# Patient Record
Sex: Female | Born: 1955 | Race: Black or African American | Hispanic: No | Smoking: Former smoker
Health system: Southern US, Community
[De-identification: ages and names within clinical notes are randomized; demographics above are authoritative.]

## PROBLEM LIST (undated history)

## (undated) ENCOUNTER — Emergency Department (HOSPITAL_COMMUNITY): Admission: EM | Payer: BLUE CROSS/BLUE SHIELD | Source: Home / Self Care

## (undated) DIAGNOSIS — D573 Sickle-cell trait: Secondary | ICD-10-CM

## (undated) DIAGNOSIS — E119 Type 2 diabetes mellitus without complications: Secondary | ICD-10-CM

## (undated) DIAGNOSIS — T7840XA Allergy, unspecified, initial encounter: Secondary | ICD-10-CM

## (undated) HISTORY — PX: DG OS CALCIS BILAT: HXRAD269

## (undated) HISTORY — DX: Sickle-cell trait: D57.3

## (undated) HISTORY — DX: Allergy, unspecified, initial encounter: T78.40XA

## (undated) HISTORY — PX: OTHER SURGICAL HISTORY: SHX169

## (undated) HISTORY — PX: DILATION AND CURETTAGE OF UTERUS: SHX78

## (undated) HISTORY — PX: TOOTH EXTRACTION: SUR596

---

## 1999-09-07 ENCOUNTER — Encounter: Payer: Self-pay | Admitting: Endocrinology

## 1999-09-07 ENCOUNTER — Encounter: Admission: RE | Admit: 1999-09-07 | Discharge: 1999-09-07 | Payer: Self-pay | Admitting: Endocrinology

## 1999-11-11 ENCOUNTER — Ambulatory Visit (HOSPITAL_COMMUNITY): Admission: RE | Admit: 1999-11-11 | Discharge: 1999-11-11 | Payer: Self-pay | Admitting: Obstetrics and Gynecology

## 1999-11-11 ENCOUNTER — Encounter: Payer: Self-pay | Admitting: Obstetrics and Gynecology

## 1999-11-24 ENCOUNTER — Ambulatory Visit (HOSPITAL_COMMUNITY): Admission: RE | Admit: 1999-11-24 | Discharge: 1999-11-24 | Payer: Self-pay | Admitting: Obstetrics and Gynecology

## 1999-11-24 ENCOUNTER — Encounter (INDEPENDENT_AMBULATORY_CARE_PROVIDER_SITE_OTHER): Payer: Self-pay

## 2000-11-17 ENCOUNTER — Encounter: Admission: RE | Admit: 2000-11-17 | Discharge: 2000-11-17 | Payer: Self-pay | Admitting: Internal Medicine

## 2000-11-17 ENCOUNTER — Encounter: Payer: Self-pay | Admitting: Internal Medicine

## 2001-03-05 ENCOUNTER — Encounter: Payer: Self-pay | Admitting: Internal Medicine

## 2001-03-05 ENCOUNTER — Encounter: Admission: RE | Admit: 2001-03-05 | Discharge: 2001-03-05 | Payer: Self-pay | Admitting: Internal Medicine

## 2002-03-29 ENCOUNTER — Other Ambulatory Visit: Admission: RE | Admit: 2002-03-29 | Discharge: 2002-03-29 | Payer: Self-pay | Admitting: Obstetrics and Gynecology

## 2002-04-15 ENCOUNTER — Encounter: Admission: RE | Admit: 2002-04-15 | Discharge: 2002-04-15 | Payer: Self-pay | Admitting: Internal Medicine

## 2002-04-15 ENCOUNTER — Encounter: Payer: Self-pay | Admitting: Internal Medicine

## 2002-04-24 ENCOUNTER — Encounter: Admission: RE | Admit: 2002-04-24 | Discharge: 2002-04-24 | Payer: Self-pay | Admitting: Internal Medicine

## 2002-04-24 ENCOUNTER — Encounter: Payer: Self-pay | Admitting: Internal Medicine

## 2002-05-27 ENCOUNTER — Emergency Department (HOSPITAL_COMMUNITY): Admission: EM | Admit: 2002-05-27 | Discharge: 2002-05-27 | Payer: Self-pay | Admitting: Emergency Medicine

## 2002-06-05 ENCOUNTER — Encounter: Payer: Self-pay | Admitting: Internal Medicine

## 2002-06-05 ENCOUNTER — Encounter: Admission: RE | Admit: 2002-06-05 | Discharge: 2002-06-05 | Payer: Self-pay | Admitting: Internal Medicine

## 2002-10-02 ENCOUNTER — Encounter: Admission: RE | Admit: 2002-10-02 | Discharge: 2002-10-02 | Payer: Self-pay | Admitting: Internal Medicine

## 2002-10-02 ENCOUNTER — Encounter: Payer: Self-pay | Admitting: Internal Medicine

## 2004-08-25 ENCOUNTER — Other Ambulatory Visit: Admission: RE | Admit: 2004-08-25 | Discharge: 2004-08-25 | Payer: Self-pay | Admitting: Obstetrics and Gynecology

## 2004-09-09 ENCOUNTER — Ambulatory Visit (HOSPITAL_COMMUNITY): Admission: RE | Admit: 2004-09-09 | Discharge: 2004-09-09 | Payer: Self-pay | Admitting: Obstetrics and Gynecology

## 2005-12-14 ENCOUNTER — Other Ambulatory Visit: Admission: RE | Admit: 2005-12-14 | Discharge: 2005-12-14 | Payer: Self-pay | Admitting: Obstetrics and Gynecology

## 2006-12-30 ENCOUNTER — Encounter: Admission: RE | Admit: 2006-12-30 | Discharge: 2006-12-30 | Payer: Self-pay | Admitting: Emergency Medicine

## 2007-11-21 ENCOUNTER — Emergency Department (HOSPITAL_COMMUNITY): Admission: EM | Admit: 2007-11-21 | Discharge: 2007-11-21 | Payer: Self-pay | Admitting: Emergency Medicine

## 2009-01-12 ENCOUNTER — Emergency Department (HOSPITAL_COMMUNITY): Admission: EM | Admit: 2009-01-12 | Discharge: 2009-01-12 | Payer: Self-pay | Admitting: Emergency Medicine

## 2009-01-24 IMAGING — CR DG CHEST 2V
2 series · 2 of 2 positions shown · non-contrast
Comparison: none

CLINICAL DATA: Wheezing, cough, congestion, sore throat. 
 CHEST - 2 VIEW:

[w chest pa *]
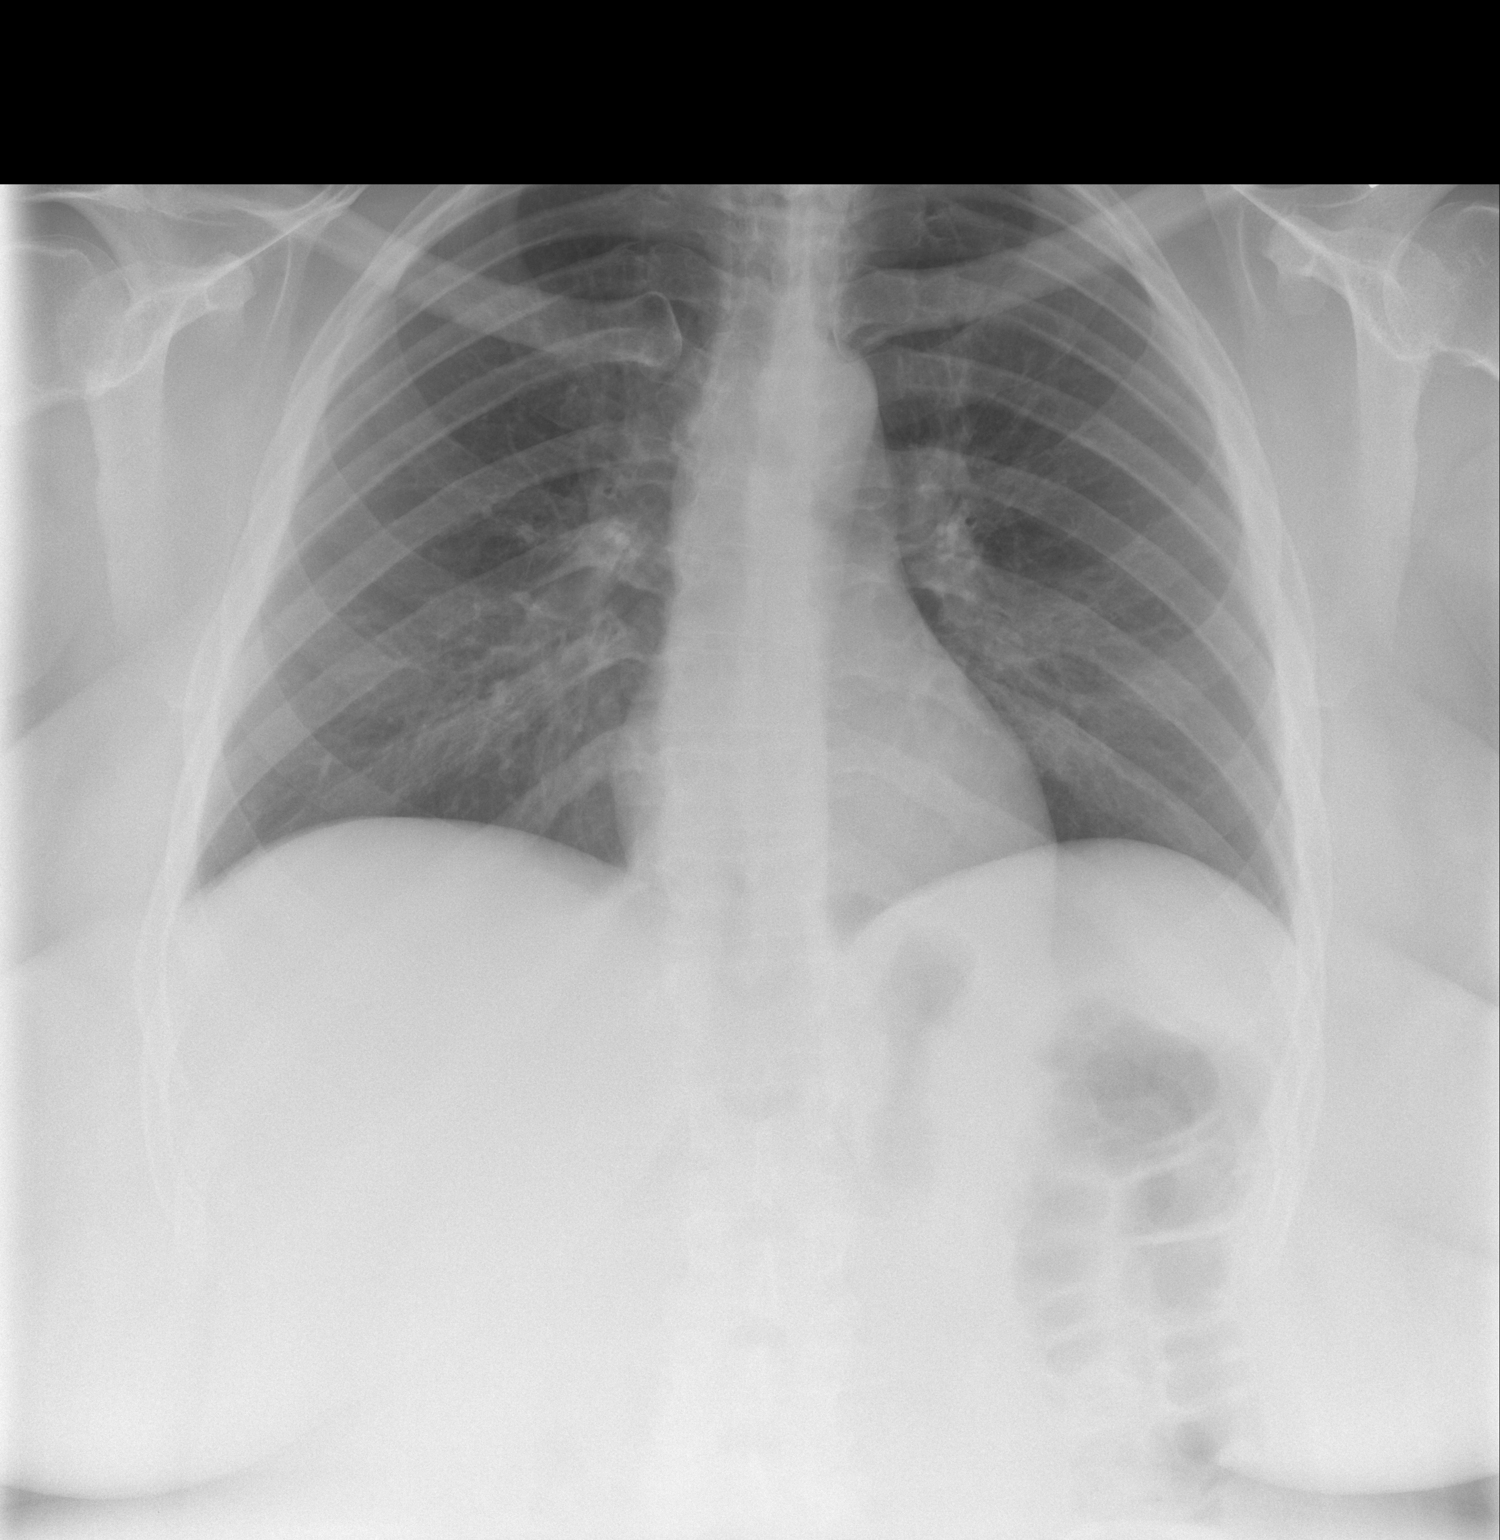

[w chest lat *]
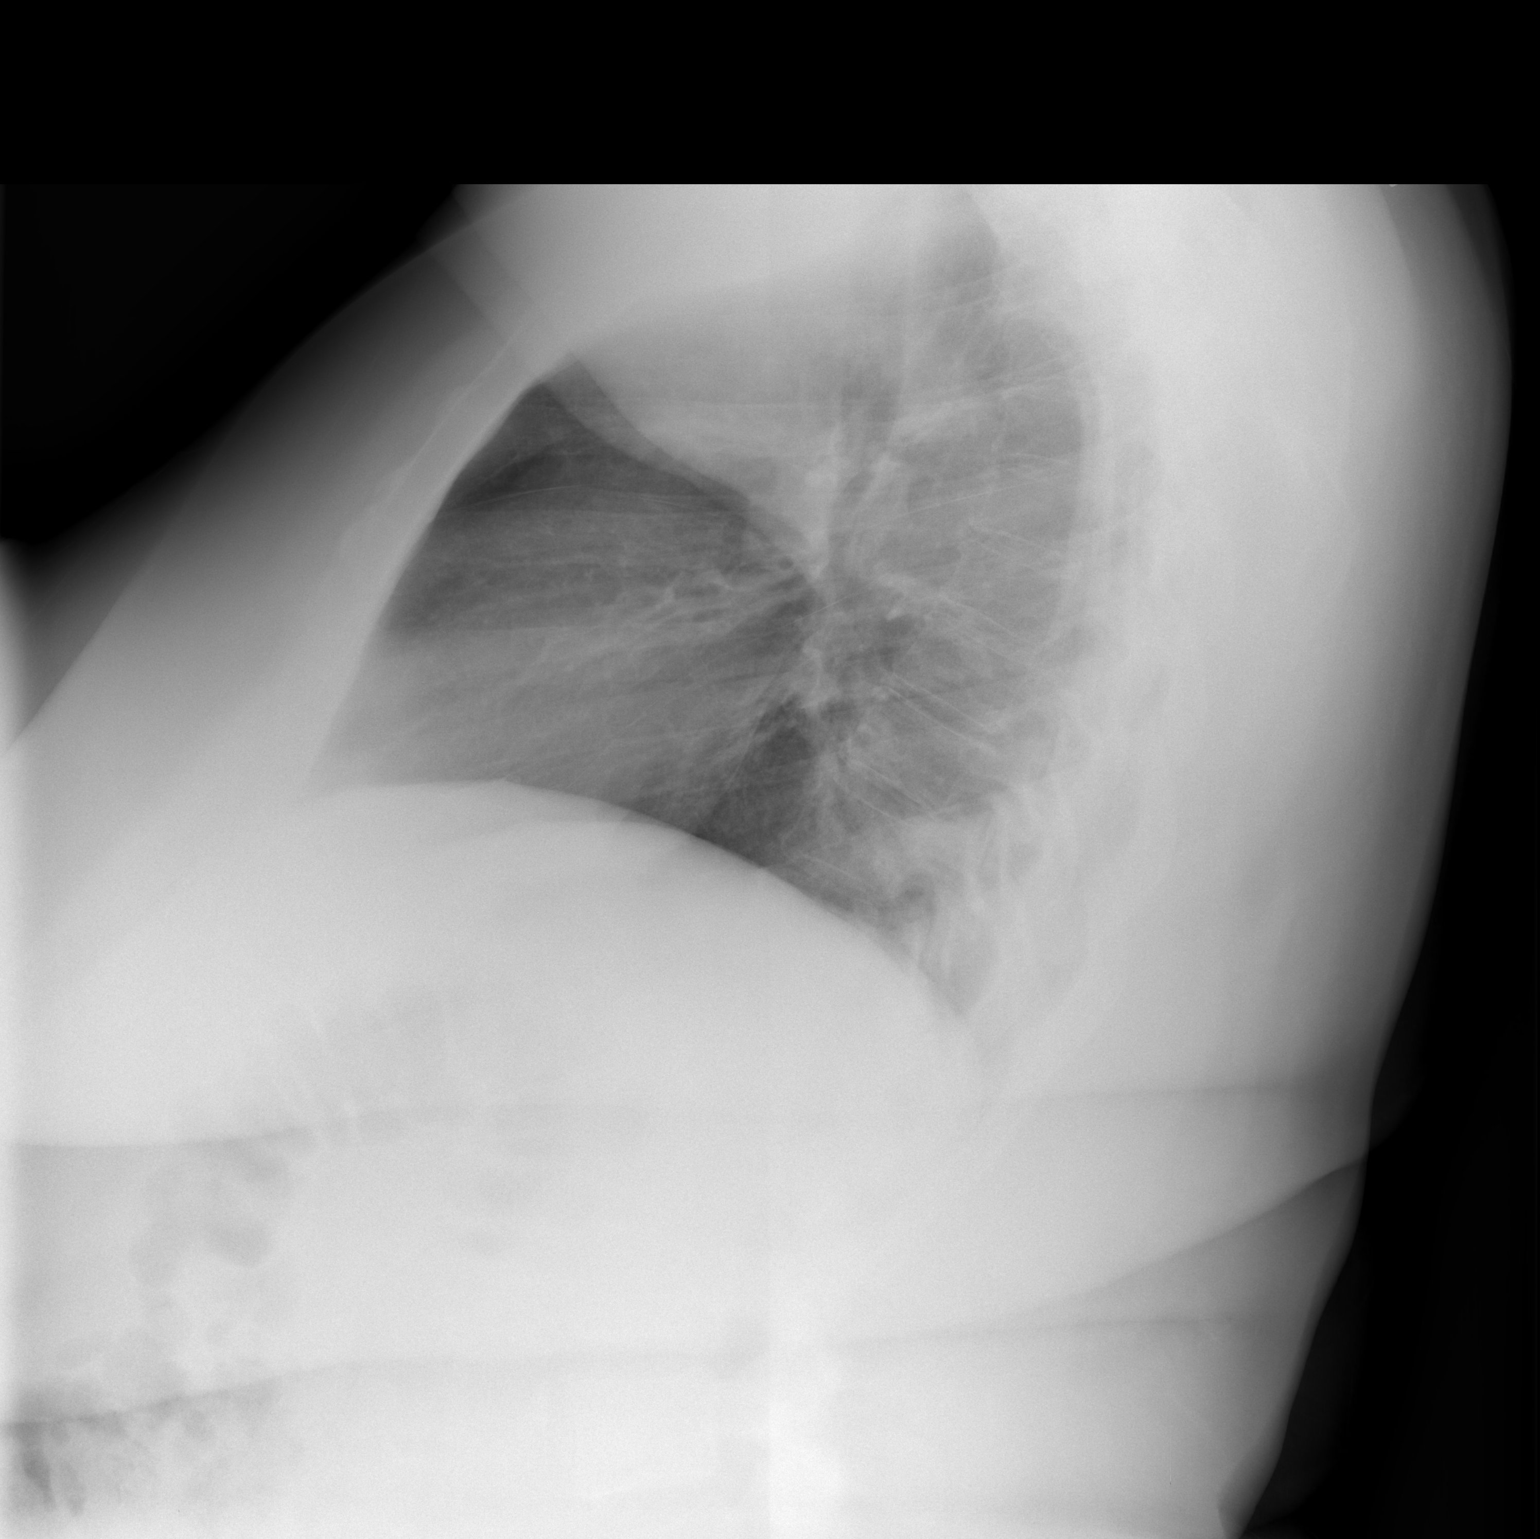

[2 of 2 positions shown; findings below may reference images not displayed]

FINDINGS: Cardiomediastinal silhouette is within normal limits.  Mild peribronchial thickening is identified.  On the lateral film there is probable airspace opacity in the posterior lower lobe suspicious for pneumonia.  No evidence of pleural effusions or pneumothorax.  
 The bony thorax is within normal limits.
IMPRESSION: 1.  Suspect posterior lower lobe pneumonia on the lateral view.  Follow-up to resolution recommended. 
 2.  Mild peribronchial thickening.

## 2009-06-03 ENCOUNTER — Encounter: Admission: RE | Admit: 2009-06-03 | Discharge: 2009-06-03 | Payer: Self-pay | Admitting: Infectious Diseases

## 2010-09-20 ENCOUNTER — Emergency Department (HOSPITAL_COMMUNITY)
Admission: EM | Admit: 2010-09-20 | Discharge: 2010-09-20 | Payer: Self-pay | Source: Home / Self Care | Admitting: Emergency Medicine

## 2010-10-11 ENCOUNTER — Encounter: Payer: Self-pay | Admitting: Infectious Diseases

## 2011-02-04 NOTE — H&P (Signed)
[place]  Patient:    Janet Johnson, Janet Johnson                        MRN: [id number] Attending:  Janine Limbo, M.D.                         History and Physical  SUBJECTIVE:  The patient is a 55 year old female, GIV, PI/0/II/I, who presents t eight weeks gestation with a missed abortion.  The patient has been followed at [facility] Gynecology for  this pregnancy, that has been complicated by diabetes.  The patient had an ultrasound performed on 11/11/99, which showed a six-week one-day gestational sac, but no evidence of an intrauterine pregnancy and no signs of a fetal heart rate.  A follow-up ultrasound was performed on 11/22/99, which showed an eight-week gestational sac with no intrauterine gestation, and no fetal heart tones seen. The patient had actually begun spotting and bleeding.  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  In April, 1997, the patient  delivered a 7 lb 15 oz female infant at [redacted] weeks gestation.  PAST MEDICAL HISTORY:  The patient has chronic hypertension and insulin-dependent diabetes.  The patient did have a D&C in 1995, associated with a miscarriage. he patient was diagnosed with hemorrhoids in 1997, after her delivery.  DRUG ALLERGIES:  None.  SOCIAL HISTORY:  The patient is married and she works as a Customer service manager. he denies cigarette use, alcohol use and recreational drug use.  REVIEW OF SYSTEMS:  Noncontributory.  FAMILY HISTORY:  Father has diabetes.  Brother has hypertension, and currently as kidney failure.  PHYSICAL EXAMINATION:  GENERAL:  Weight 311 lb.  HEENT:  Within normal limits.  CHEST:  Clear.  HEART:  Regular rate and rhythm.  BREASTS:  Without masses.  ABDOMEN:  Nontender.  EXTREMITIES:  Within normal limits.  NEUROLOGIC:  Exam is normal.  PELVIC:  The cervix is closed.  Uterus is difficult to palpate because  of obesity.  LABORATORY VALUES:  Blood type A positive.  ASSESSMENT:  Missed abortion at eight weeks gestation.  PLAN:  The patient will undergo a dilatation and curettage.  She understands the indications for her procedure, and she accepts the risks of, but not limited to: anesthetic complications, bleeding, infection and possible damage to the surrounding organs. DD:  11/23/99 TD:  11/23/99 Job: [id number]

## 2011-02-04 NOTE — Op Note (Signed)
[facility]  Patient:    Janet Johnson, Janet Johnson                      MRN: [id number] Proc. Date: 11/24/99 Adm. Date:  [id number] Attending:  Leonard Schwartz                           Operative Report  PREOPERATIVE DIAGNOSIS:       [redacted] weeks gestation.  Obesity (weight is greater than 300 pounds).  Missed abortion.  POSTOPERATIVE DIAGNOSIS:      [redacted] weeks gestation.  Obesity (weight is greater than 300 pounds).  Missed abortion.  OPERATION:                    Suction dilatation and evacuation.  SURGEON:                      Janine Limbo, M.D.  ASSISTANT:  ANESTHESIA:                   General.  ESTIMATED BLOOD LOSS:  INDICATIONS:                  [name] is a 55 year old female, gravida 3, para 1-0-2-1, who presents at eight weeks gestation with two ultrasounds that confirmed a missed abortion.  The patient understands the indications for her procedure and she accepts the risks of, but not limited to, anesthetic complications, bleeding, infections, and possible damage to the surrounding organs.  FINDINGS:                     The patients blood type is A positive.  The uterus sounded to 9 cm.  A moderate amount of products of conception were removed within what seemed to be a normal intrauterine cavity.  DESCRIPTION OF PROCEDURE:     The patient was taken to the operating room where a general anesthesia was given.  The patients perineum and vagina were prepped with multiple layers of Betadine.  The bladder was drained of urine.  Examination under anesthesia was performed.  A paracervical block was placed using 10 cc of 1% Xylocaine without epinephrine.  The uterus was sounded to 9 cm.  The cervix was  gradually dilated.  The uterine cavity was evacuated using a #8 suction curet and then a medium sharp curet.  The cavity was felt to be clean at the end of our procedure.  Hemostasis was noted to be adequate.  All instruments  were removed.  Repeat examination showed a firm uterus.  The patient was taken to the recovery  room after her anesthetic was reversed.  She was noted to be stable.  FOLLOW-UP:                    The patient was given Darvocet-N 100 one tablet every four to six hours to be used for pain.  She will return to see Janine Limbo, M.D. in two to three weeks.  She was given a copy of the postoperative instruction sheet as prepared by [facility] for patients who have undergone a D&C. DD:  11/24/99 TD:  11/24/99 Job: [id number]

## 2011-06-13 LAB — BASIC METABOLIC PANEL
Calcium: 9.1
Creatinine, Ser: 0.87
GFR calc Af Amer: >60
GFR calc non Af Amer: >60

## 2011-06-13 LAB — CBC
MCHC: 34.6
RBC: 4.63
WBC: 8.3

## 2011-06-13 LAB — DIFFERENTIAL
Basophils Relative: 2 — ABNORMAL HIGH
Lymphocytes Relative: 49 — ABNORMAL HIGH
Monocytes Relative: 8
Neutro Abs: 3.1
Neutrophils Relative %: 37 — ABNORMAL LOW

## 2011-06-13 LAB — RAPID STREP SCREEN (MED CTR MEBANE ONLY): Streptococcus, Group A Screen (Direct): NEGATIVE

## 2012-11-09 ENCOUNTER — Emergency Department (HOSPITAL_COMMUNITY)
Admission: EM | Admit: 2012-11-09 | Discharge: 2012-11-09 | Disposition: A | Discharge status: Home / Self Care | Payer: Self-pay | Attending: Emergency Medicine | Admitting: Emergency Medicine

## 2012-11-09 ENCOUNTER — Encounter (HOSPITAL_COMMUNITY): Payer: Self-pay | Admitting: Emergency Medicine

## 2012-11-09 DIAGNOSIS — E119 Type 2 diabetes mellitus without complications: Secondary | ICD-10-CM | POA: Insufficient documentation

## 2012-11-09 DIAGNOSIS — H579 Unspecified disorder of eye and adnexa: Secondary | ICD-10-CM | POA: Insufficient documentation

## 2012-11-09 DIAGNOSIS — H109 Unspecified conjunctivitis: Secondary | ICD-10-CM

## 2012-11-09 DIAGNOSIS — Z79899 Other long term (current) drug therapy: Secondary | ICD-10-CM | POA: Insufficient documentation

## 2012-11-09 HISTORY — DX: Type 2 diabetes mellitus without complications: E11.9

## 2012-11-09 MED ORDER — ERYTHROMYCIN 5 MG/GM OP OINT: TOPICAL_OINTMENT | OPHTHALMIC | Status: DC

## 2012-11-09 NOTE — ED Provider Notes (Signed)
History    This chart was scribed for non-physician practitioner working with Derwood Kaplan, MD by Sofie Rower, ED Scribe. This patient was seen in room WTR8/WTR8 and the patient's care was started at 3:04PM.   CSN: [id number]  Arrival date & time 11/09/12  1426   None     Chief Complaint  Patient presents with  . Eye Pain    (Consider location/radiation/quality/duration/timing/severity/associated sxs/prior treatment) The history is provided by the patient. No language interpreter was used.    Janet Johnson is a 57 y.o. female , with a hx of diabetes mellitus without complication, who presents to the Emergency Department complaining of gradual, progressively worsening, eye pain, located at the left eye, onset two days ago (11/07/12).  Associated symptoms include itching located at the left eye. The pt reports she has been experiencing an itching eye pain sensation for the past two days, which is not relieved by the application of mureliene nor Visine eye drops. Additionally, the pt informs that she is able to close her right eye which provides mild relief of the left eye pain.  The pt denies any other medical problems, in addition to allergies to any antibiotics at this time.   The pt does not smoke or drink alcohol.     Past Medical History  Diagnosis Date  . Diabetes mellitus without complication     History reviewed. No pertinent past surgical history.  History reviewed. No pertinent family history.  History  Substance Use Topics  . Smoking status: Never Smoker   . Smokeless tobacco: Not on file  . Alcohol Use: No    OB History   Grav Para Term Preterm Abortions TAB SAB Ect Mult Living                  Review of Systems  10 Systems reviewed and all are negative for acute change except as noted in the HPI.    Allergies  Review of patient's allergies indicates no known allergies.  Home Medications   Current Outpatient Rx  Name  Route  Sig  Dispense  Refill   . B Complex-C (B-COMPLEX WITH VITAMIN C) tablet   Oral   Take 1 tablet by mouth daily.         . Coenzyme Q10 (COQ10 PO)   Oral   Take 1 capsule by mouth daily.         . fish oil-omega-3 fatty acids 1000 MG capsule   Oral   Take 3 g by mouth daily.         Marland Kitchen ibuprofen (ADVIL,MOTRIN) 200 MG tablet   Oral   Take 400 mg by mouth every 8 (eight) hours as needed for pain.         . niacin (NIASPAN) 500 MG CR tablet   Oral   Take 500 mg by mouth at bedtime.         . Probiotic Product (PROBIOTIC DAILY PO)   Oral   Take 1 capsule by mouth daily.         Marland Kitchen tetrahydrozoline 0.05 % ophthalmic solution   Both Eyes   Place 1 drop into both eyes every 6 (six) hours as needed (for dry eyes).           BP 121/74  Pulse 90  Temp(Src) 97.9 F (36.6 C) (Oral)  SpO2 97%  Physical Exam  Nursing note and vitals reviewed. Constitutional: She is oriented to person, place, and time. She appears well-developed and well-nourished.  No distress.  HENT:  Head: Normocephalic and atraumatic.  Eyes: EOM are normal. Pupils are equal, round, and reactive to light.  Tonometer reading performed. Reveals a pressure of 22. Left conjunctiva mildly inflamed and red.Fluorescein and woods lamp reveal no corneal abrasions nor uptake. Visual acuity: 20/30 right eye 20/70 left eye.   Neck: Neck supple. No tracheal deviation present.  Cardiovascular: Normal rate.   Pulmonary/Chest: Effort normal. No respiratory distress.  Musculoskeletal: Normal range of motion.  Neurological: She is alert and oriented to person, place, and time.  Skin: Skin is warm and dry.  Psychiatric: She has a normal mood and affect. Her behavior is normal.    ED Course  Procedures (including critical care time)  DIAGNOSTIC STUDIES: Oxygen Saturation is 97% on room air, normal by my interpretation.    COORDINATION OF CARE:  3:13 PM- Treatment plan discussed with patient. Pt agrees with treatment.   3:16PM-  Fluorescein evaluation for corneal abrasions performed. None detected upon exam.  3:19PM- Tonometer reading performed. Reveals a pressure of 22.   3:20 PM- Treatment plan concerning application of antibiotics and follow up discussed with patient. Pt agrees with treatment.      Labs Reviewed - No data to display No results found.   1. Conjunctivitis       MDM  57 year old female with conjunctivitis in the left eye. Superior to be uncomplicated, and I will manage with Romycin. Referral to an ophthalmologist is been given, the patient's symptoms do not improve.      I personally performed the services described in this documentation, which was scribed in my presence. The recorded information has been reviewed and is accurate.     Roxy Horseman, PA-C 11/09/12 [phone]

## 2012-11-09 NOTE — ED Notes (Signed)
Pt c/o left eye pain for several days.  

## 2012-11-10 NOTE — ED Provider Notes (Signed)
Medical screening examination/treatment/procedure(s) were performed by non-physician practitioner and as supervising physician I was immediately available for consultation/collaboration.  Derwood Kaplan, MD 11/10/12 [phone]

## 2013-04-02 ENCOUNTER — Other Ambulatory Visit: Payer: Self-pay

## 2013-04-02 DIAGNOSIS — Z1231 Encounter for screening mammogram for malignant neoplasm of breast: Secondary | ICD-10-CM

## 2013-04-25 ENCOUNTER — Ambulatory Visit
Admission: RE | Admit: 2013-04-25 | Discharge: 2013-04-25 | Disposition: A | Discharge status: Home / Self Care | Payer: Medicaid Other | Source: Ambulatory Visit

## 2013-04-25 DIAGNOSIS — Z1231 Encounter for screening mammogram for malignant neoplasm of breast: Secondary | ICD-10-CM

## 2013-08-02 ENCOUNTER — Ambulatory Visit (INDEPENDENT_AMBULATORY_CARE_PROVIDER_SITE_OTHER): Payer: Medicaid Other | Admitting: Advanced Practice Midwife

## 2013-08-02 ENCOUNTER — Encounter: Payer: Self-pay | Admitting: Advanced Practice Midwife

## 2013-08-02 VITALS — BP 137/77 | HR 84 | Temp 98.1°F | Ht 66.0 in | Wt 244.0 lb

## 2013-08-02 DIAGNOSIS — Z6839 Body mass index (BMI) 39.0-39.9, adult: Secondary | ICD-10-CM

## 2013-08-02 DIAGNOSIS — Z01419 Encounter for gynecological examination (general) (routine) without abnormal findings: Secondary | ICD-10-CM

## 2013-08-02 DIAGNOSIS — E119 Type 2 diabetes mellitus without complications: Secondary | ICD-10-CM | POA: Insufficient documentation

## 2013-08-02 DIAGNOSIS — N889 Noninflammatory disorder of cervix uteri, unspecified: Secondary | ICD-10-CM

## 2013-08-02 DIAGNOSIS — Z0000 Encounter for general adult medical examination without abnormal findings: Secondary | ICD-10-CM

## 2013-08-02 LAB — POCT URINALYSIS DIPSTICK
Nitrite, UA: NEGATIVE
Spec Grav, UA: 1.02
Urobilinogen, UA: NEGATIVE

## 2013-08-02 NOTE — Progress Notes (Signed)
Subjective:     Janet Johnson is a 57 y.o. female here for a routine exam.  Current complaints: none.  Personal health questionnaire reviewed: yes.  Patient here for an annual exam. She has a 59 yo daughter. Her husband died suddenly over 1 year ago. She is not sexually active. Denies any GYN concerns or significant history. Denies history of abnormal pap smears.   Has not had any uterine bleeding for 3 years.    Gynecologic History No LMP recorded. Patient is postmenopausal. Contraception: none Last Pap: 2011. Results were: normal Last mammogram: 2014. Results were: normal  Obstetric History OB History  No data available  History of 1 NSVD  The following portions of the patient's history were reviewed and updated as appropriate: allergies, current medications, past family history, past medical history, past social history, past surgical history and problem list.  Review of Systems A comprehensive review of systems was negative.    Objective:    BP 137/77  Pulse 84  Temp(Src) 98.1 F (36.7 C)  Ht 5\' 6"  (1.676 m)  Wt 244 lb (110.678 kg)  BMI 39.40 kg/m2  General Appearance:    Alert, cooperative, no distress, appears stated age  Head:    Normocephalic, without obvious abnormality, atraumatic  Eyes:    PERRL, conjunctiva/corneas clear, EOM's intact, fundi    benign, both eyes  Ears:    Normal TM's and external ear canals, both ears  Nose:   Nares normal, septum midline, mucosa normal, no drainage    or sinus tenderness  Throat:   Lips, mucosa, and tongue normal; teeth and gums normal  Neck:   Supple, symmetrical, trachea midline, no adenopathy;    thyroid:  no enlargement/tenderness/nodules; no carotid   bruit or JVD  Back:     Symmetric, no curvature, ROM normal, no CVA tenderness  Lungs:     Clear to auscultation bilaterally, respirations unlabored  Chest Wall:    No tenderness or deformity   Heart:    Regular rate and rhythm, S1 and S2 normal, no murmur, rub   or  gallop  Breast Exam:    No tenderness, masses, or nipple abnormality. Dense tissue along bra line under left breast  Abdomen:     Soft, non-tender, bowel sounds active all four quadrants,    no masses, no organomegaly  Genitalia:    Normal female without discharge or tenderness   Lesion noted at approx 12 oclock, white, irregular borders, circular, .5mm approx. Friable cervix.  Extremities:   Extremities normal, atraumatic, no cyanosis or edema  Pulses:   2+ and symmetric all extremities  Skin:   Skin color, texture, turgor normal, no rashes or lesions  Lymph nodes:   Cervical, supraclavicular, and axillary nodes normal  Neurologic:   CNII-XII intact, normal strength, sensation and reflexes    throughout      Assessment:    Cervical lesion, colpo and biopsy pending Patient Active Problem List   Diagnosis Date Noted  . Diabetes mellitus 08/02/2013  . BMI 39.0-39.9,adult 08/02/2013  . Cervical lesion 08/02/2013      Plan:    Education reviewed: calcium supplements, depression evaluation, low fat, low cholesterol diet, safe sex/STD prevention, self breast exams, skin cancer screening and weight bearing exercise. Contraception: post menopausal status and .Marland Kitchen Follow up in: for colposcopy at next available appt .Marland Kitchen   Patient to cont w/ PCP for management. They have evaluated her cholesterol, and completed a health panel. I recommend she have her TSH  screened if she hasn't. Colonoscopy scheduled in December. Mammogram done and WNL, 2 months ago, repeat in 1 year.  45 min spent with patient greater than 80% spent in counseling and coordination of care.   Quintus Premo Wilson Singer CNM

## 2013-08-02 NOTE — Addendum Note (Signed)
Addended by: George Hugh on: 08/02/2013 02:47 PM   Modules accepted: Orders

## 2013-08-05 LAB — PAP IG W/ RFLX HPV ASCU

## 2013-08-28 ENCOUNTER — Encounter: Payer: Medicaid Other | Admitting: Obstetrics & Gynecology

## 2013-09-23 ENCOUNTER — Encounter: Payer: Medicaid Other | Admitting: Obstetrics & Gynecology

## 2013-09-25 ENCOUNTER — Encounter: Payer: Self-pay | Admitting: Obstetrics & Gynecology

## 2013-09-25 ENCOUNTER — Ambulatory Visit (INDEPENDENT_AMBULATORY_CARE_PROVIDER_SITE_OTHER): Payer: Medicaid Other | Admitting: Obstetrics & Gynecology

## 2013-09-25 VITALS — BP 121/74 | HR 87 | Temp 97.6°F | Ht 66.0 in | Wt 239.0 lb

## 2013-09-25 DIAGNOSIS — N888 Other specified noninflammatory disorders of cervix uteri: Secondary | ICD-10-CM

## 2013-09-25 NOTE — Progress Notes (Signed)
Colposcopy Procedure Note  Indications: Pap smear several months ago showed: no abnormalities. There was lesion noted on exam.  Procedure Details  The risks and benefits of the procedure and Written informed consent obtained.  A time-out was performed confirming the patient, procedure and allergy status  Speculum placed in vagina and excellent visualization of cervix achieved, cervix swabbed x 3 with acetic acid solution.  Findings: Cervix: no abnormal vasculature; cervical biopsies taken at 1 o'clock.  The lesion was cystic, filled with mucin.      Specimens: cervical biopsy  Complications: none.  Plan: Will base further treatment on Pathology findings.

## 2013-09-27 NOTE — Patient Instructions (Signed)
Colposcopy Care After Colposcopy is a procedure in which a special tool is used to magnify the surface of the cervix. A tissue sample (biopsy) may also be taken. This sample will be looked at for cervical cancer or other problems. After the test:  You may have some cramping.  Lie down for a few minutes if you feel lightheaded.   You may have some bleeding which should stop in a few days. HOME CARE  Do not have sex or use tampons for 2 to 3 days or as told.  Only take medicine as told by your doctor.  Continue to take your birth control pills as usual. Finding out the results of your test Ask when your test results will be ready. Make sure you get your test results. GET HELP RIGHT AWAY IF:  You are bleeding a lot or are passing blood clots.  You develop a fever of 102 F (38.9 C) or higher.  You have abnormal vaginal discharge.  You have cramps that do not go away with medicine.  You feel lightheaded, dizzy, or pass out (faint). MAKE SURE YOU:   Understand these instructions.  Will watch your condition.  Will get help right away if you are not doing well or get worse. Document Released: 02/22/2008 Document Revised: 11/28/2011 Document Reviewed: 04/04/2013 Exit[employer] Patient Information 2014 [employer].  

## 2013-09-27 NOTE — Progress Notes (Signed)
   Subjective:    Patient ID: Janet Johnson, female    DOB: 10-09-1955, 58 y.o.   MRN: [id number]  HPI    Review of Systems     Objective:   Physical Exam  Genitourinary:            Assessment & Plan:

## 2013-10-14 ENCOUNTER — Encounter: Payer: Self-pay | Admitting: *Deleted

## 2013-10-14 ENCOUNTER — Encounter: Payer: Self-pay | Admitting: Obstetrics & Gynecology

## 2013-10-14 DIAGNOSIS — N870 Mild cervical dysplasia: Secondary | ICD-10-CM | POA: Insufficient documentation

## 2013-12-30 ENCOUNTER — Ambulatory Visit: Payer: Medicaid Other | Admitting: Obstetrics & Gynecology

## 2014-06-11 ENCOUNTER — Other Ambulatory Visit: Payer: Self-pay

## 2014-06-11 DIAGNOSIS — Z1231 Encounter for screening mammogram for malignant neoplasm of breast: Secondary | ICD-10-CM

## 2014-06-26 ENCOUNTER — Ambulatory Visit
Admission: RE | Admit: 2014-06-26 | Discharge: 2014-06-26 | Disposition: A | Discharge status: Home / Self Care | Payer: Medicaid Other | Source: Ambulatory Visit

## 2014-06-26 DIAGNOSIS — Z1231 Encounter for screening mammogram for malignant neoplasm of breast: Secondary | ICD-10-CM

## 2014-09-15 ENCOUNTER — Encounter: Payer: Self-pay | Admitting: *Deleted

## 2014-09-16 ENCOUNTER — Encounter: Payer: Self-pay | Admitting: Obstetrics & Gynecology

## 2014-10-30 ENCOUNTER — Telehealth: Payer: Self-pay | Admitting: *Deleted

## 2014-10-30 NOTE — Telephone Encounter (Signed)
appt for annual exam has been scheduled for pt.  Pt aware.

## 2014-11-11 ENCOUNTER — Ambulatory Visit (INDEPENDENT_AMBULATORY_CARE_PROVIDER_SITE_OTHER): Payer: Medicaid Other | Admitting: Certified Nurse Midwife

## 2014-11-11 ENCOUNTER — Encounter: Payer: Self-pay | Admitting: Certified Nurse Midwife

## 2014-11-11 ENCOUNTER — Telehealth: Payer: Self-pay

## 2014-11-11 VITALS — BP 118/70 | HR 91 | Temp 98.0°F | Ht 66.0 in | Wt 232.0 lb

## 2014-11-11 DIAGNOSIS — Z0000 Encounter for general adult medical examination without abnormal findings: Secondary | ICD-10-CM | POA: Diagnosis not present

## 2014-11-11 DIAGNOSIS — Z01419 Encounter for gynecological examination (general) (routine) without abnormal findings: Secondary | ICD-10-CM

## 2014-11-11 DIAGNOSIS — Z1239 Encounter for other screening for malignant neoplasm of breast: Secondary | ICD-10-CM

## 2014-11-11 NOTE — Patient Instructions (Signed)
Kegel Exercises The goal of Kegel exercises is to isolate and exercise your pelvic floor muscles. These muscles act as a hammock that supports the rectum, vagina, small intestine, and uterus. As the muscles weaken, the hammock sags and these organs are displaced from their normal positions. Kegel exercises can strengthen your pelvic floor muscles and help you to improve bladder and bowel control, improve sexual response, and help reduce many problems and some discomfort during pregnancy. Kegel exercises can be done anywhere and at any time. HOW TO PERFORM KEGEL EXERCISES 1. Locate your pelvic floor muscles. To do this, squeeze (contract) the muscles that you use when you try to stop the flow of urine. You will feel a tightness in the vaginal area (women) and a tight lift in the rectal area (men and women). 2. When you begin, contract your pelvic muscles tight for 2-5 seconds, then relax them for 2-5 seconds. This is one set. Do 4-5 sets with a short pause in between. 3. Contract your pelvic muscles for 8-10 seconds, then relax them for 8-10 seconds. Do 4-5 sets. If you cannot contract your pelvic muscles for 8-10 seconds, try 5-7 seconds and work your way up to 8-10 seconds. Your goal is 4-5 sets of 10 contractions each day. Keep your stomach, buttocks, and legs relaxed during the exercises. Perform sets of both short and long contractions. Vary your positions. Perform these contractions 3-4 times per day. Perform sets while you are:   Lying in bed in the morning.  Standing at lunch.  Sitting in the late afternoon.  Lying in bed at night. You should do 40-50 contractions per day. Do not perform more Kegel exercises per day than recommended. Overexercising can cause muscle fatigue. Continue these exercises for for at least 15-20 weeks or as directed by your caregiver. Document Released: 08/22/2012 Document Reviewed: 08/22/2012 [employer] Patient Information 2015 [employer]. This information is  not intended to replace advice given to you by your health care provider. Make sure you discuss any questions you have with your health care provider.  

## 2014-11-11 NOTE — Telephone Encounter (Signed)
tried to let patient know when mammogram is - last one was Oct. 8 2015 - her phone has been disconnected

## 2014-11-11 NOTE — Progress Notes (Signed)
Patient ID: Janet MoreJudith T Messler, female   DOB: May 09, 1956, 59 y.o.   MRN: [id number]    Subjective:     Janet Johnson is a 59 y.o. female here for a routine exam.  Current complaints: none.  Currently not-sexually active, is considering becoming sexually active.  Does report some incontinence with coughing, not bothersome.    Personal health questionnaire:  Is patient Ashkenazi Jewish, have a family history of breast and/or ovarian cancer: no Is there a family history of uterine cancer diagnosed at age < 2850, gastrointestinal cancer, urinary tract cancer, family member who is a Personnel officerLynch syndrome-associated carrier: no Is the patient overweight and hypertensive, family history of diabetes, personal history of gestational diabetes, preeclampsia or PCOS: yes Is patient over 5855, have PCOS,  family history of premature CHD under age 59, diabetes, smoke, have hypertension or peripheral artery disease:  yes At any time, has a partner hit, kicked or otherwise hurt or frightened you?: no Over the past 2 weeks, have you felt down, depressed or hopeless?: no Over the past 2 weeks, have you felt little interest or pleasure in doing things?:no   Gynecologic History No LMP recorded. Patient is postmenopausal. Contraception: post menopausal status Last Pap: 08/02/2013. Results were: normal.  Had colpo 09/25/13 with Dr. Tamela OddiJackson-Moore for Cervical cyst removal, normal.   Last mammogram: 07/27/2014. Results were: normal  Obstetric History OB History  Gravida Para Term Preterm AB SAB TAB Ectopic Multiple Living  4 1 1  3 3    1     # Outcome Date GA Lbr Len/2nd Weight Sex Delivery Anes PTL Lv  4 SAB 2000     SAB     3 Term 01/12/96 2747w0d  3.515 kg (7 lb 12 oz) F Vag-Spont EPI N Y  2 SAB 1996     SAB     1 SAB 1995 2050w0d    SAB         Past Medical History  Diagnosis Date  . Diabetes mellitus without complication   . Allergy   . Sickle cell trait     Past Surgical History  Procedure Laterality Date  .  Currentment    . Dg os calcis bilat    . Dilation and curettage of uterus Bilateral   . Tooth extraction Right      Current outpatient prescriptions:  .  B Complex-C (B-COMPLEX WITH VITAMIN C) tablet, Take 1 tablet by mouth daily., Disp: , Rfl:  .  Canagliflozin (INVOKANA) 300 MG TABS, Take by mouth., Disp: , Rfl:  .  cetirizine (ZYRTEC) 10 MG tablet, Take 10 mg by mouth at bedtime., Disp: , Rfl:  .  Cod Liver Oil CAPS, Take by mouth daily., Disp: , Rfl:  .  glimepiride (AMARYL) 4 MG tablet, Take 4 mg by mouth 2 (two) times daily., Disp: , Rfl:  .  ibuprofen (ADVIL,MOTRIN) 200 MG tablet, Take 400 mg by mouth every 8 (eight) hours as needed for pain., Disp: , Rfl:  .  losartan-hydrochlorothiazide (HYZAAR) 50-12.5 MG per tablet, Take 1 tablet by mouth daily., Disp: , Rfl:  .  Magnesium 300 MG CAPS, Take by mouth., Disp: , Rfl:  .  metFORMIN (GLUCOPHAGE) 1000 MG tablet, Take 1,000 mg by mouth., Disp: , Rfl:  .  Multiple Vitamins-Minerals (CENTRUM SILVER ULTRA WOMENS PO), Take by mouth., Disp: , Rfl:  .  niacin (NIASPAN) 500 MG CR tablet, Take 500 mg by mouth at bedtime., Disp: , Rfl:  .  aspirin 81 MG  tablet, Take 81 mg by mouth daily., Disp: , Rfl:  Allergies  Allergen Reactions  . Dust Mite Extract   . Mold Extract [Trichophyton]     History  Substance Use Topics  . Smoking status: Former Games developer  . Smokeless tobacco: Former Neurosurgeon    Quit date: 11/11/1984  . Alcohol Use: No    Family History  Problem Relation Age of Onset  . Diabetes Father       Review of Systems  Constitutional: negative for fatigue and weight loss Respiratory: negative for cough and wheezing Cardiovascular: negative for chest pain, fatigue and palpitations Gastrointestinal: negative for abdominal pain and change in bowel habits Musculoskeletal:negative for myalgias Neurological: negative for gait problems and tremors Behavioral/Psych: negative for abusive relationship, depression Endocrine: negative for  temperature intolerance   Genitourinary:negative for abnormal menstrual periods, genital lesions, hot flashes, sexual problems and vaginal discharge Integument/breast: negative for breast lump, breast tenderness, nipple discharge and skin lesion(s)    Objective:       BP 118/70 mmHg  Pulse 91  Temp(Src) 98 F (36.7 C)  Ht  (1.676 m)  Wt 105.235 kg (232 lb)  BMI 37.46 kg/m2 General:   alert  Skin:   no rash or abnormalities  Lungs:   clear to auscultation bilaterally  Heart:   regular rate and rhythm, S1, S2 normal, no murmur, click, rub or gallop  Breasts:   normal without suspicious masses, skin or nipple changes or axillary nodes  Abdomen:  normal findings: no organomegaly, soft, non-tender and no hernia  Pelvis:  External genitalia: normal general appearance Urinary system: urethral meatus normal and bladder without fullness, nontender Vaginal: normal without tenderness, induration or masses Cervix: normal appearance Adnexa: normal bimanual exam Uterus: anteverted and non-tender, normal size   Lab Review Urine pregnancy test Labs reviewed yes Radiologic studies reviewed yes  25% of 30 min visit spent on counseling and coordination of care.    Assessment:   Stress urinary incontinence.   Healthy female exam.   Diabetic Obesity Plan:    Education reviewed: depression evaluation, low fat, low cholesterol diet, self breast exams, skin cancer screening and weight bearing exercise. Education reviewed about Kegel exercises.  Encouraged patient to see a podiatrist and dermatologist.   Mammogram ordered. Follow up in: 1 year.   Meds ordered this encounter  Medications  . Magnesium 300 MG CAPS    Sig: Take by mouth.  . Multiple Vitamins-Minerals (CENTRUM SILVER ULTRA WOMENS PO)    Sig: Take by mouth.   Orders Placed This Encounter  Procedures  . MM DIGITAL SCREENING BILATERAL    Standing Status: Future     Number of Occurrences:      Standing Expiration Date:  01/10/2016    Order Specific Question:  Reason for Exam (SYMPTOM  OR DIAGNOSIS REQUIRED)    Answer:  Annual Exam    Order Specific Question:  Is the patient pregnant?    Answer:  No    Order Specific Question:  Preferred imaging location?    Answer:  [facility]   Need to obtain previous records from primary care.  Patient is scheduled for colonoscopy later in March this year.  Patient declined nutrition or urology referral.

## 2014-11-13 LAB — PAP IG AND HPV HIGH-RISK: HPV DNA HIGH RISK: NOT DETECTED

## 2015-01-07 ENCOUNTER — Other Ambulatory Visit (HOSPITAL_COMMUNITY): Payer: Self-pay | Admitting: Internal Medicine

## 2015-01-07 DIAGNOSIS — I739 Peripheral vascular disease, unspecified: Secondary | ICD-10-CM

## 2015-01-07 DIAGNOSIS — I10 Essential (primary) hypertension: Secondary | ICD-10-CM

## 2015-01-08 ENCOUNTER — Ambulatory Visit (HOSPITAL_COMMUNITY)
Admission: RE | Admit: 2015-01-08 | Discharge: 2015-01-08 | Disposition: A | Discharge status: Home / Self Care | Payer: Medicaid Other | Source: Ambulatory Visit | Attending: Internal Medicine | Admitting: Internal Medicine

## 2015-01-08 DIAGNOSIS — I10 Essential (primary) hypertension: Secondary | ICD-10-CM | POA: Diagnosis not present

## 2015-01-08 DIAGNOSIS — M79609 Pain in unspecified limb: Secondary | ICD-10-CM

## 2015-01-08 DIAGNOSIS — I739 Peripheral vascular disease, unspecified: Secondary | ICD-10-CM

## 2015-01-08 NOTE — Progress Notes (Signed)
VASCULAR LAB PRELIMINARY  ARTERIAL  ABI completed: Within normal limits.    RIGHT    LEFT    PRESSURE WAVEFORM  PRESSURE WAVEFORM  BRACHIAL 127 Triphasic BRACHIAL 127 Triphasic  DP   DP    AT 130 Triphasic AT 128 Triphasic  PT 137 Triphasic PT 128 Triphasic  PER   PER    GREAT TOE  NA GREAT TOE  NA    RIGHT LEFT  ABI 1.08 1.01     Farrel DemarkJill Eunice, RDMS, RVT  01/08/2015, 2:54 PM

## 2015-01-26 ENCOUNTER — Encounter: Payer: Self-pay | Admitting: Podiatry

## 2015-01-26 ENCOUNTER — Ambulatory Visit (INDEPENDENT_AMBULATORY_CARE_PROVIDER_SITE_OTHER): Payer: Medicaid Other | Admitting: Podiatry

## 2015-01-26 VITALS — BP 102/57 | HR 84 | Resp 12

## 2015-01-26 DIAGNOSIS — L74519 Primary focal hyperhidrosis, unspecified: Secondary | ICD-10-CM | POA: Diagnosis not present

## 2015-01-26 DIAGNOSIS — B351 Tinea unguium: Secondary | ICD-10-CM | POA: Diagnosis not present

## 2015-01-26 DIAGNOSIS — R61 Generalized hyperhidrosis: Secondary | ICD-10-CM

## 2015-01-26 DIAGNOSIS — M79676 Pain in unspecified toe(s): Secondary | ICD-10-CM | POA: Diagnosis not present

## 2015-01-26 NOTE — Progress Notes (Signed)
   Subjective:    Patient ID: Janet Johnson, female    DOB: March 25, 1956, 59 y.o.   MRN: [id number]  HPI N-DRY SKIN, SWEATING L-B/L BOTTOM FEET/HEELS D-6 MONTHS O-SLOWLY C-WORSE A-WEARING SOCKS T-NONE  ALSO, TOENAILS HAVE DISCOLORATION. She complains of the toenails being uncomfortable when they're long when she is wearing her shoes are requesting nail debridement  She denies any history of foot ulceration or claudication She relates a history of having a arterial Doppler, however does not note results the arterial Doppler   Review of Systems  HENT: Positive for sneezing.   Respiratory: Positive for cough.        Objective:   Physical Exam  Orientated 3  Vascular: DP pulses 2/4 bilaterally PT right 2/4 PT left 0/4  Vascular examination dated 01/08/2015, lower extremity arterial Doppler ABI all within normal limits, bilaterally Reviewed by Cari Carawayhris Dickson,, M.D.  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 I made aware of the results of the arterial Doppler We discussed treatment options for the hyperhidrosis including twice a day sock changes and rotating shoes. If she still had excessive perspiration would consider prescribing Drysol Discuss treatment options for mycotic toenails and patient would like to consider active treatment including oral medication  Debridement of  toenails 1-5 and submit nail fragments for fungal culture and PAS stain  Notify patient upon receipt of lab

## 2015-01-26 NOTE — Patient Instructions (Signed)
Change socks at least twice a day  Our office will contact you with the results of the fungal culture Diabetes and Foot Care Diabetes may cause you to have problems because of poor blood supply (circulation) to your feet and legs. This may cause the skin on your feet to become thinner, break easier, and heal more slowly. Your skin may become dry, and the skin may peel and crack. You may also have nerve damage in your legs and feet causing decreased feeling in them. You may not notice minor injuries to your feet that could lead to infections or more serious problems. Taking care of your feet is one of the most important things you can do for yourself.  HOME CARE INSTRUCTIONS  Wear shoes at all times, even in the house. Do not go barefoot. Bare feet are easily injured.  Check your feet daily for blisters, cuts, and redness. If you cannot see the bottom of your feet, use a mirror or ask someone for help.  Wash your feet with warm water (do not use hot water) and mild soap. Then pat your feet and the areas between your toes until they are completely dry. Do not soak your feet as this can dry your skin.  Apply a moisturizing lotion or petroleum jelly (that does not contain alcohol and is unscented) to the skin on your feet and to dry, brittle toenails. Do not apply lotion between your toes.  Trim your toenails straight across. Do not dig under them or around the cuticle. File the edges of your nails with an emery board or nail file.  Do not cut corns or calluses or try to remove them with medicine.  Wear clean socks or stockings every day. Make sure they are not too tight. Do not wear knee-high stockings since they may decrease blood flow to your legs.  Wear shoes that fit properly and have enough cushioning. To break in new shoes, wear them for just a few hours a day. This prevents you from injuring your feet. Always look in your shoes before you put them on to be sure there are no objects  inside.  Do not cross your legs. This may decrease the blood flow to your feet.  If you find a minor scrape, cut, or break in the skin on your feet, keep it and the skin around it clean and dry. These areas may be cleansed with mild soap and water. Do not cleanse the area with peroxide, alcohol, or iodine.  When you remove an adhesive bandage, be sure not to damage the skin around it.  If you have a wound, look at it several times a day to make sure it is healing.  Do not use heating pads or hot water bottles. They may burn your skin. If you have lost feeling in your feet or legs, you may not know it is happening until it is too late.  Make sure your health care provider performs a complete foot exam at least annually or more often if you have foot problems. Report any cuts, sores, or bruises to your health care provider immediately. SEEK MEDICAL CARE IF:   You have an injury that is not healing.  You have cuts or breaks in the skin.  You have an ingrown nail.  You notice redness on your legs or feet.  You feel burning or tingling in your legs or feet.  You have pain or cramps in your legs and feet.  Your legs or  feet are numb.  Your feet always feel cold. SEEK IMMEDIATE MEDICAL CARE IF:   There is increasing redness, swelling, or pain in or around a wound.  There is a red line that goes up your leg.  Pus is coming from a wound.  You develop a fever or as directed by your health care provider.  You notice a bad smell coming from an ulcer or wound. Document Released: 09/02/2000 Document Revised: 05/08/2013 Document Reviewed: 02/12/2013 [facility] Patient Information 2015 [place], [place]. This information is not intended to replace advice given to you by your health care provider. Make sure you discuss any questions you have with your health care provider.

## 2015-02-25 ENCOUNTER — Telehealth: Payer: Self-pay | Admitting: *Deleted

## 2015-02-25 ENCOUNTER — Encounter: Payer: Self-pay | Admitting: Podiatry

## 2015-02-25 NOTE — Telephone Encounter (Signed)
Pt's fungal culture results of 12/27/2014 are positive for mold per Dr. Theotis Burrowuchman's review.  Dr. Leeanne Deeduchman orders urea 39% cream to be applied to affected area in the morning and OTC FungiNail at night.  Pt's phone has been disconnected orders were mailed to pt.

## 2015-06-29 ENCOUNTER — Ambulatory Visit (HOSPITAL_COMMUNITY): Payer: Medicaid Other

## 2016-10-03 ENCOUNTER — Other Ambulatory Visit: Payer: Self-pay | Admitting: Orthopedic Surgery

## 2016-10-03 DIAGNOSIS — M545 Low back pain: Secondary | ICD-10-CM

## 2016-10-11 ENCOUNTER — Other Ambulatory Visit: Payer: Medicaid Other

## 2016-10-12 ENCOUNTER — Ambulatory Visit
Admission: RE | Admit: 2016-10-12 | Discharge: 2016-10-12 | Disposition: A | Discharge status: Home / Self Care | Payer: BLUE CROSS/BLUE SHIELD | Source: Ambulatory Visit | Attending: Orthopedic Surgery | Admitting: Orthopedic Surgery

## 2016-10-12 DIAGNOSIS — M545 Low back pain: Secondary | ICD-10-CM

## 2016-11-14 ENCOUNTER — Encounter (INDEPENDENT_AMBULATORY_CARE_PROVIDER_SITE_OTHER): Payer: BLUE CROSS/BLUE SHIELD | Admitting: Ophthalmology

## 2016-11-14 DIAGNOSIS — E11311 Type 2 diabetes mellitus with unspecified diabetic retinopathy with macular edema: Secondary | ICD-10-CM | POA: Diagnosis not present

## 2016-11-14 DIAGNOSIS — H2513 Age-related nuclear cataract, bilateral: Secondary | ICD-10-CM

## 2016-11-14 DIAGNOSIS — H35033 Hypertensive retinopathy, bilateral: Secondary | ICD-10-CM | POA: Diagnosis not present

## 2016-11-14 DIAGNOSIS — I10 Essential (primary) hypertension: Secondary | ICD-10-CM

## 2016-11-14 DIAGNOSIS — H43813 Vitreous degeneration, bilateral: Secondary | ICD-10-CM

## 2016-11-14 DIAGNOSIS — E113313 Type 2 diabetes mellitus with moderate nonproliferative diabetic retinopathy with macular edema, bilateral: Secondary | ICD-10-CM

## 2016-11-22 ENCOUNTER — Other Ambulatory Visit: Payer: Self-pay | Admitting: Internal Medicine

## 2016-11-22 DIAGNOSIS — E2839 Other primary ovarian failure: Secondary | ICD-10-CM

## 2016-11-29 ENCOUNTER — Other Ambulatory Visit (INDEPENDENT_AMBULATORY_CARE_PROVIDER_SITE_OTHER): Payer: BLUE CROSS/BLUE SHIELD | Admitting: Ophthalmology

## 2016-11-30 ENCOUNTER — Other Ambulatory Visit (INDEPENDENT_AMBULATORY_CARE_PROVIDER_SITE_OTHER): Payer: BLUE CROSS/BLUE SHIELD | Admitting: Ophthalmology

## 2016-11-30 DIAGNOSIS — E11311 Type 2 diabetes mellitus with unspecified diabetic retinopathy with macular edema: Secondary | ICD-10-CM | POA: Diagnosis not present

## 2016-11-30 DIAGNOSIS — E113312 Type 2 diabetes mellitus with moderate nonproliferative diabetic retinopathy with macular edema, left eye: Secondary | ICD-10-CM | POA: Diagnosis not present

## 2016-12-09 ENCOUNTER — Encounter (INDEPENDENT_AMBULATORY_CARE_PROVIDER_SITE_OTHER): Payer: BLUE CROSS/BLUE SHIELD | Admitting: Ophthalmology

## 2016-12-09 DIAGNOSIS — E113313 Type 2 diabetes mellitus with moderate nonproliferative diabetic retinopathy with macular edema, bilateral: Secondary | ICD-10-CM

## 2016-12-09 DIAGNOSIS — I10 Essential (primary) hypertension: Secondary | ICD-10-CM

## 2016-12-09 DIAGNOSIS — H43813 Vitreous degeneration, bilateral: Secondary | ICD-10-CM

## 2016-12-09 DIAGNOSIS — H35033 Hypertensive retinopathy, bilateral: Secondary | ICD-10-CM | POA: Diagnosis not present

## 2016-12-09 DIAGNOSIS — H2513 Age-related nuclear cataract, bilateral: Secondary | ICD-10-CM

## 2016-12-09 DIAGNOSIS — E11311 Type 2 diabetes mellitus with unspecified diabetic retinopathy with macular edema: Secondary | ICD-10-CM

## 2017-01-06 ENCOUNTER — Encounter (INDEPENDENT_AMBULATORY_CARE_PROVIDER_SITE_OTHER): Payer: BLUE CROSS/BLUE SHIELD | Admitting: Ophthalmology

## 2017-01-26 ENCOUNTER — Encounter (INDEPENDENT_AMBULATORY_CARE_PROVIDER_SITE_OTHER): Payer: BLUE CROSS/BLUE SHIELD | Admitting: Ophthalmology

## 2017-02-09 ENCOUNTER — Encounter (INDEPENDENT_AMBULATORY_CARE_PROVIDER_SITE_OTHER): Payer: BLUE CROSS/BLUE SHIELD | Admitting: Ophthalmology

## 2017-02-09 DIAGNOSIS — H43813 Vitreous degeneration, bilateral: Secondary | ICD-10-CM | POA: Diagnosis not present

## 2017-02-09 DIAGNOSIS — H2513 Age-related nuclear cataract, bilateral: Secondary | ICD-10-CM | POA: Diagnosis not present

## 2017-02-09 DIAGNOSIS — H35033 Hypertensive retinopathy, bilateral: Secondary | ICD-10-CM | POA: Diagnosis not present

## 2017-02-09 DIAGNOSIS — E113313 Type 2 diabetes mellitus with moderate nonproliferative diabetic retinopathy with macular edema, bilateral: Secondary | ICD-10-CM

## 2017-02-09 DIAGNOSIS — E11311 Type 2 diabetes mellitus with unspecified diabetic retinopathy with macular edema: Secondary | ICD-10-CM | POA: Diagnosis not present

## 2017-02-09 DIAGNOSIS — I10 Essential (primary) hypertension: Secondary | ICD-10-CM | POA: Diagnosis not present

## 2017-03-08 ENCOUNTER — Encounter (INDEPENDENT_AMBULATORY_CARE_PROVIDER_SITE_OTHER): Payer: BLUE CROSS/BLUE SHIELD | Admitting: Ophthalmology

## 2017-03-29 ENCOUNTER — Encounter (INDEPENDENT_AMBULATORY_CARE_PROVIDER_SITE_OTHER): Payer: BLUE CROSS/BLUE SHIELD | Admitting: Ophthalmology

## 2017-04-12 ENCOUNTER — Encounter (INDEPENDENT_AMBULATORY_CARE_PROVIDER_SITE_OTHER): Payer: BLUE CROSS/BLUE SHIELD | Admitting: Ophthalmology

## 2017-04-12 DIAGNOSIS — E113313 Type 2 diabetes mellitus with moderate nonproliferative diabetic retinopathy with macular edema, bilateral: Secondary | ICD-10-CM

## 2017-04-12 DIAGNOSIS — I10 Essential (primary) hypertension: Secondary | ICD-10-CM | POA: Diagnosis not present

## 2017-04-12 DIAGNOSIS — H43813 Vitreous degeneration, bilateral: Secondary | ICD-10-CM

## 2017-04-12 DIAGNOSIS — H35033 Hypertensive retinopathy, bilateral: Secondary | ICD-10-CM

## 2017-04-12 DIAGNOSIS — E11311 Type 2 diabetes mellitus with unspecified diabetic retinopathy with macular edema: Secondary | ICD-10-CM

## 2017-05-10 ENCOUNTER — Encounter (INDEPENDENT_AMBULATORY_CARE_PROVIDER_SITE_OTHER): Payer: BLUE CROSS/BLUE SHIELD | Admitting: Ophthalmology

## 2017-05-15 ENCOUNTER — Encounter (INDEPENDENT_AMBULATORY_CARE_PROVIDER_SITE_OTHER): Payer: BLUE CROSS/BLUE SHIELD | Admitting: Ophthalmology

## 2017-05-15 DIAGNOSIS — H2513 Age-related nuclear cataract, bilateral: Secondary | ICD-10-CM | POA: Diagnosis not present

## 2017-05-15 DIAGNOSIS — H35033 Hypertensive retinopathy, bilateral: Secondary | ICD-10-CM | POA: Diagnosis not present

## 2017-05-15 DIAGNOSIS — H43813 Vitreous degeneration, bilateral: Secondary | ICD-10-CM

## 2017-05-15 DIAGNOSIS — E11311 Type 2 diabetes mellitus with unspecified diabetic retinopathy with macular edema: Secondary | ICD-10-CM | POA: Diagnosis not present

## 2017-05-15 DIAGNOSIS — E113313 Type 2 diabetes mellitus with moderate nonproliferative diabetic retinopathy with macular edema, bilateral: Secondary | ICD-10-CM

## 2017-05-15 DIAGNOSIS — I10 Essential (primary) hypertension: Secondary | ICD-10-CM | POA: Diagnosis not present

## 2017-06-19 ENCOUNTER — Encounter (INDEPENDENT_AMBULATORY_CARE_PROVIDER_SITE_OTHER): Payer: BLUE CROSS/BLUE SHIELD | Admitting: Ophthalmology

## 2017-06-19 DIAGNOSIS — H35033 Hypertensive retinopathy, bilateral: Secondary | ICD-10-CM | POA: Diagnosis not present

## 2017-06-19 DIAGNOSIS — E11311 Type 2 diabetes mellitus with unspecified diabetic retinopathy with macular edema: Secondary | ICD-10-CM | POA: Diagnosis not present

## 2017-06-19 DIAGNOSIS — I10 Essential (primary) hypertension: Secondary | ICD-10-CM | POA: Diagnosis not present

## 2017-06-19 DIAGNOSIS — E113313 Type 2 diabetes mellitus with moderate nonproliferative diabetic retinopathy with macular edema, bilateral: Secondary | ICD-10-CM

## 2017-06-19 DIAGNOSIS — H43813 Vitreous degeneration, bilateral: Secondary | ICD-10-CM | POA: Diagnosis not present

## 2017-06-19 DIAGNOSIS — H2513 Age-related nuclear cataract, bilateral: Secondary | ICD-10-CM | POA: Diagnosis not present

## 2017-07-17 ENCOUNTER — Encounter (INDEPENDENT_AMBULATORY_CARE_PROVIDER_SITE_OTHER): Payer: BLUE CROSS/BLUE SHIELD | Admitting: Ophthalmology

## 2017-07-19 ENCOUNTER — Encounter (INDEPENDENT_AMBULATORY_CARE_PROVIDER_SITE_OTHER): Payer: BLUE CROSS/BLUE SHIELD | Admitting: Ophthalmology

## 2017-07-19 DIAGNOSIS — E11311 Type 2 diabetes mellitus with unspecified diabetic retinopathy with macular edema: Secondary | ICD-10-CM | POA: Diagnosis not present

## 2017-07-19 DIAGNOSIS — H35033 Hypertensive retinopathy, bilateral: Secondary | ICD-10-CM | POA: Diagnosis not present

## 2017-07-19 DIAGNOSIS — H43813 Vitreous degeneration, bilateral: Secondary | ICD-10-CM | POA: Diagnosis not present

## 2017-07-19 DIAGNOSIS — I10 Essential (primary) hypertension: Secondary | ICD-10-CM | POA: Diagnosis not present

## 2017-07-19 DIAGNOSIS — E113313 Type 2 diabetes mellitus with moderate nonproliferative diabetic retinopathy with macular edema, bilateral: Secondary | ICD-10-CM | POA: Diagnosis not present

## 2017-08-23 ENCOUNTER — Encounter (INDEPENDENT_AMBULATORY_CARE_PROVIDER_SITE_OTHER): Payer: BLUE CROSS/BLUE SHIELD | Admitting: Ophthalmology

## 2017-09-05 ENCOUNTER — Encounter (INDEPENDENT_AMBULATORY_CARE_PROVIDER_SITE_OTHER): Payer: BLUE CROSS/BLUE SHIELD | Admitting: Ophthalmology

## 2017-09-06 ENCOUNTER — Encounter (INDEPENDENT_AMBULATORY_CARE_PROVIDER_SITE_OTHER): Payer: BLUE CROSS/BLUE SHIELD | Admitting: Ophthalmology

## 2017-09-06 DIAGNOSIS — E113313 Type 2 diabetes mellitus with moderate nonproliferative diabetic retinopathy with macular edema, bilateral: Secondary | ICD-10-CM | POA: Diagnosis not present

## 2017-09-06 DIAGNOSIS — H35033 Hypertensive retinopathy, bilateral: Secondary | ICD-10-CM

## 2017-09-06 DIAGNOSIS — I10 Essential (primary) hypertension: Secondary | ICD-10-CM

## 2017-09-06 DIAGNOSIS — H2513 Age-related nuclear cataract, bilateral: Secondary | ICD-10-CM

## 2017-09-06 DIAGNOSIS — E11311 Type 2 diabetes mellitus with unspecified diabetic retinopathy with macular edema: Secondary | ICD-10-CM | POA: Diagnosis not present

## 2017-09-06 DIAGNOSIS — H43813 Vitreous degeneration, bilateral: Secondary | ICD-10-CM

## 2017-09-29 ENCOUNTER — Other Ambulatory Visit: Payer: Self-pay | Admitting: Internal Medicine

## 2017-09-29 DIAGNOSIS — Z1231 Encounter for screening mammogram for malignant neoplasm of breast: Secondary | ICD-10-CM

## 2017-10-11 ENCOUNTER — Encounter (INDEPENDENT_AMBULATORY_CARE_PROVIDER_SITE_OTHER): Payer: BLUE CROSS/BLUE SHIELD | Admitting: Ophthalmology

## 2017-10-26 ENCOUNTER — Ambulatory Visit
Admission: RE | Admit: 2017-10-26 | Discharge: 2017-10-26 | Disposition: A | Discharge status: Home / Self Care | Payer: BLUE CROSS/BLUE SHIELD | Source: Ambulatory Visit | Attending: Internal Medicine | Admitting: Internal Medicine

## 2017-10-26 DIAGNOSIS — E2839 Other primary ovarian failure: Secondary | ICD-10-CM

## 2017-10-26 DIAGNOSIS — Z1231 Encounter for screening mammogram for malignant neoplasm of breast: Secondary | ICD-10-CM

## 2017-12-16 IMAGING — MR MR LUMBAR SPINE W/O CM
4 of 5 series · 19 of 48 positions shown · non-contrast
Comparison: 12/30/2006

CLINICAL DATA: Low back pain with occasional bilateral leg weakness
for 6 months.

EXAM:
MRI LUMBAR SPINE WITHOUT CONTRAST
TECHNIQUE: Multiplanar, multisequence MR imaging of the lumbar spine was
performed. No intravenous contrast was administered.

[Series 6: T2 · sagittal · 4.0mm · 0.73mm/px · 7 of 15 slices shown (1 of 2)]
[im 1/15]
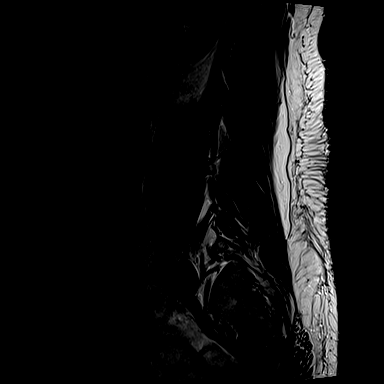
[im 3/15]
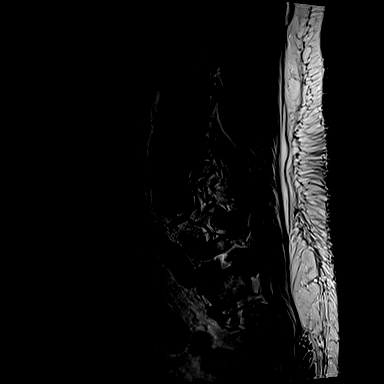
[im 5/15]
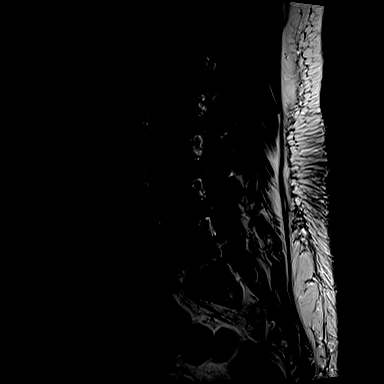
[im 8/15]
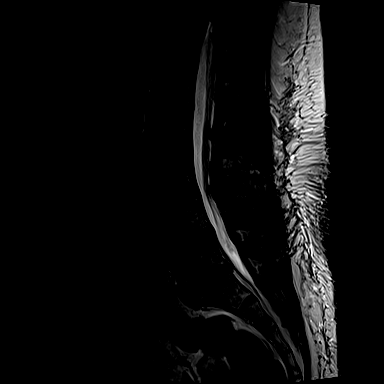
[im 10/15]
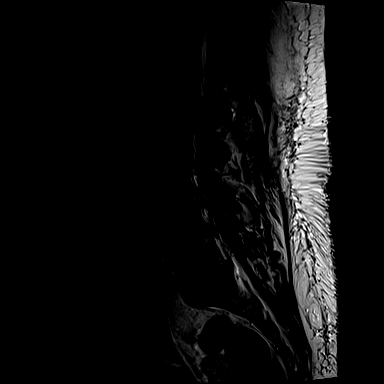
[im 12/15]
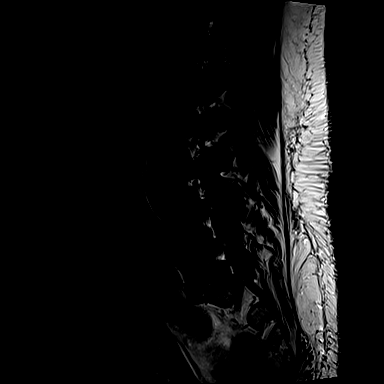
[im 15/15]
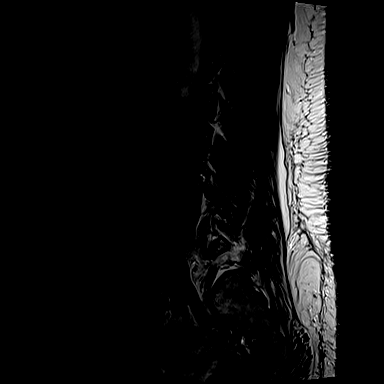

[Series 7: T1 · sagittal · 4.0mm · 0.73mm/px · 3 of 15 slices shown (1 of 2)]
[im 3/15]
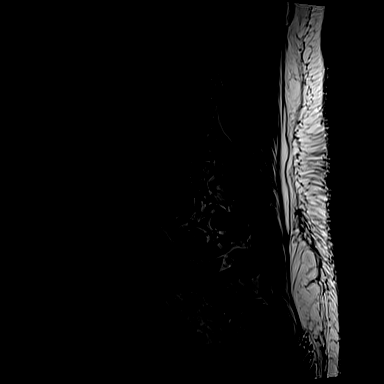
[im 8/15]
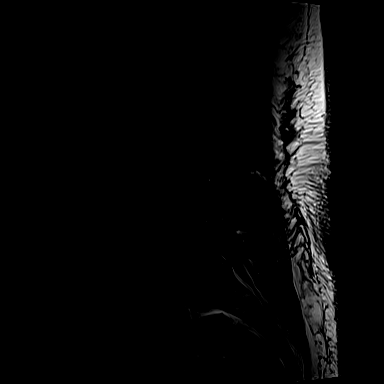
[im 12/15]
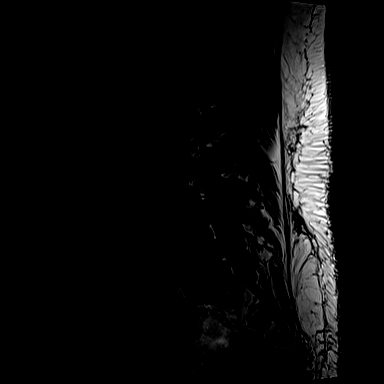

[Series 14: T2 · axial · 4.0mm · 0.28mm/px · z∈[-11,+139]mm · 6 of 33 slices shown (2 of 2)]
[im 1/33]
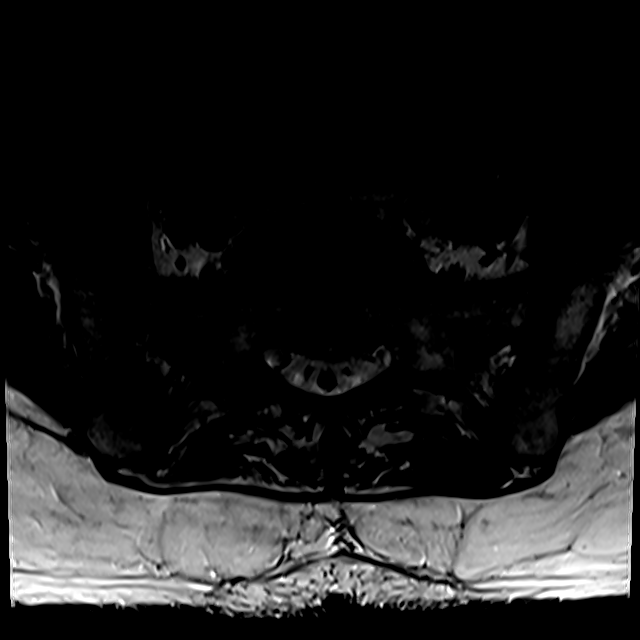
[im 5/33]
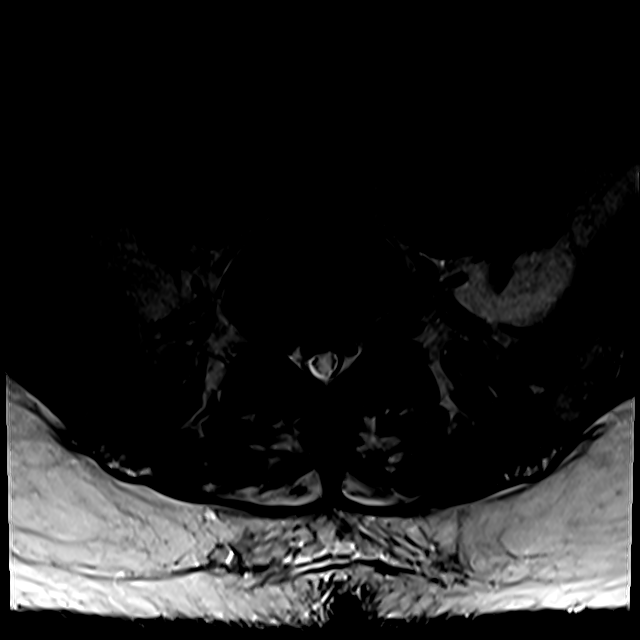
[im 10/33]
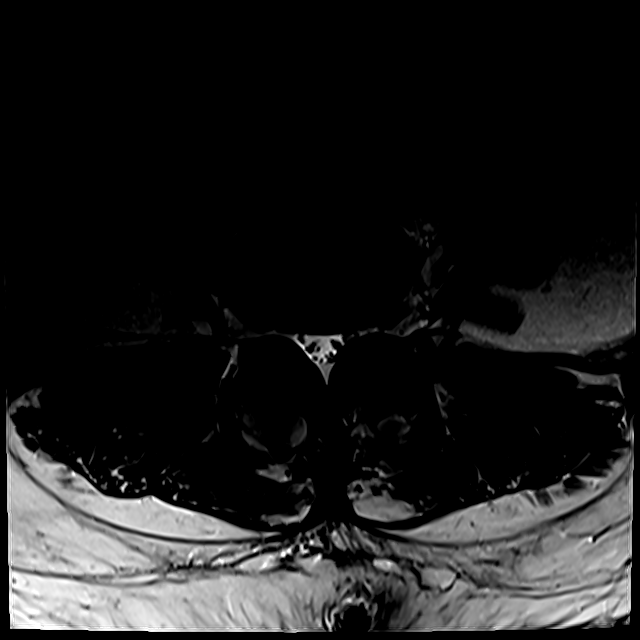
[im 15/33]
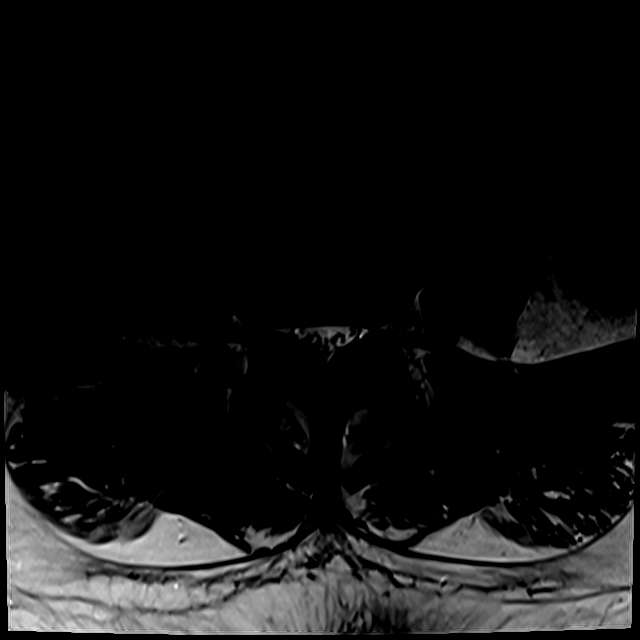
[im 18/33]
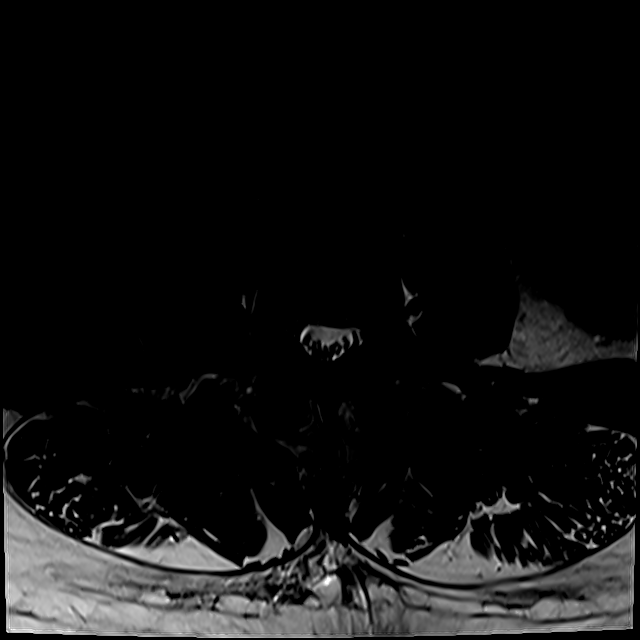
[im 28/33]
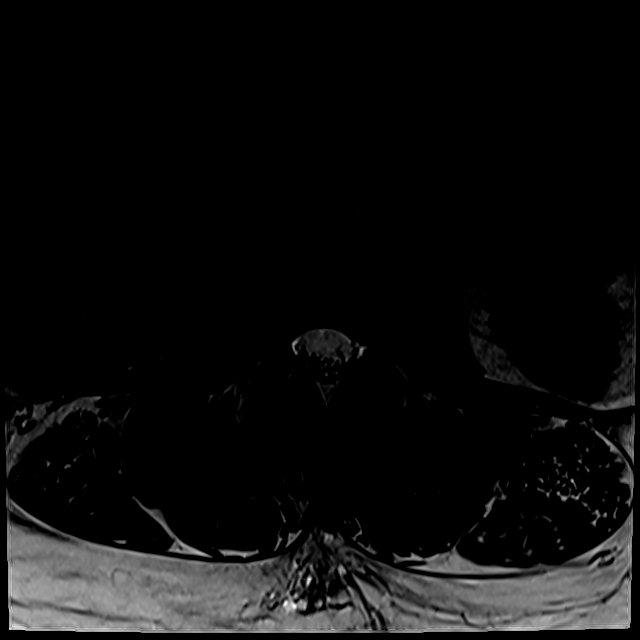

[Series 17: T1 · axial · 4.0mm · 0.28mm/px · z∈[+14,+139]mm · 3 of 33 slices shown (2 of 2)]
[im 5/33]
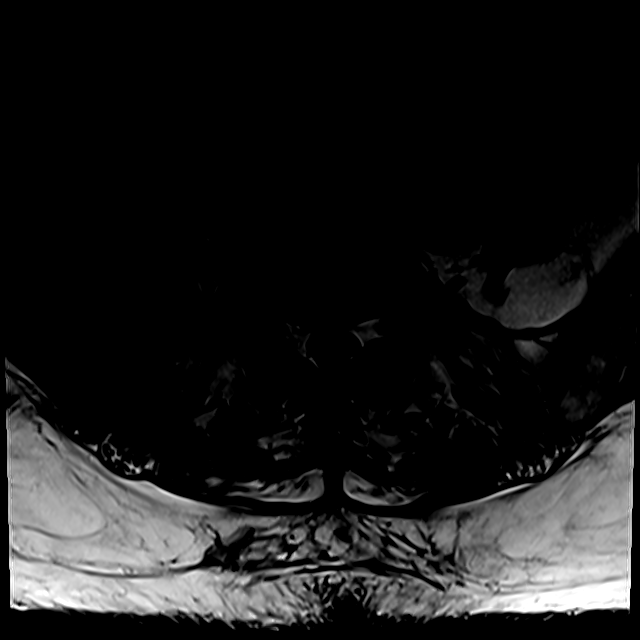
[im 18/33]
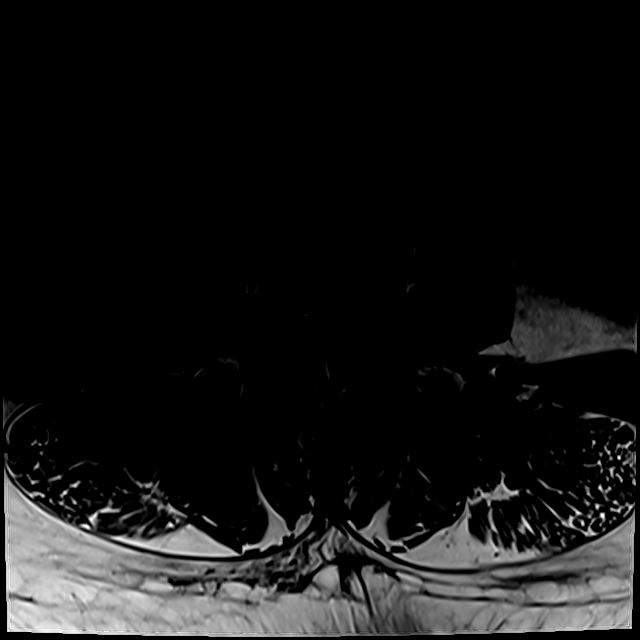
[im 28/33]
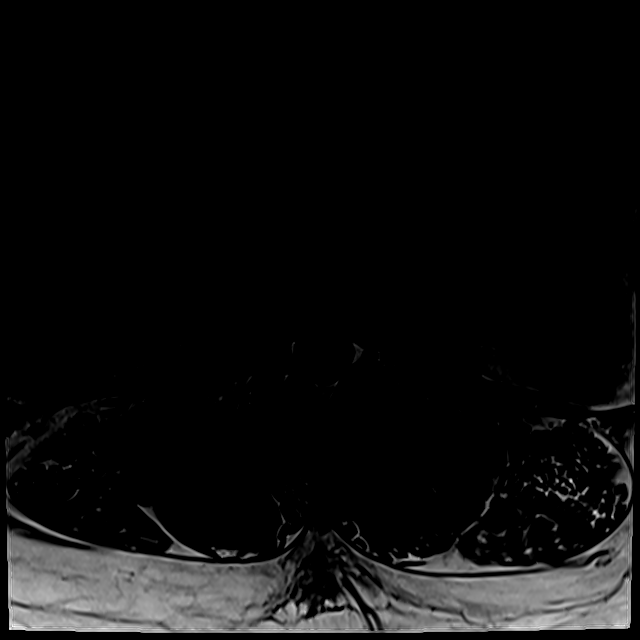

[19 of 48 positions shown; findings below may reference images not displayed]

FINDINGS: Segmentation:  Standard.

Alignment: Trace anterolisthesis of L3 on L4, new and likely
secondary to progressive facet arthrosis.

Vertebrae: Preserve vertebral body heights without evidence of
fracture or suspicious osseous lesion. Mild marrow edema associated
with L3-4 facet arthritis. Mild type 2 degenerative endplate changes
at L5-S1 which are new.

Conus medullaris: Extends to the T12-L1 level and appears normal.

Paraspinal and other soft tissues: Unremarkable.

Disc levels:

L1-2:  Negative.

L2-3:  Mild facet hypertrophy without disc herniation or stenosis.

L3-4: New trace anterolisthesis, new disc bulging greater to the
right, progressive severe facet and ligamentum flavum hypertrophy,
and congenitally short pedicles result in new mild spinal stenosis
and mild right neural foraminal stenosis.

L4-5: Disc desiccation. Mild disc bulging, moderate facet and
ligamentum flavum hypertrophy, and congenitally short pedicles
result in mild spinal stenosis, mild left lateral recess stenosis,
and minimal right neural foraminal stenosis, not significantly
changed.

L5-S1: Disc desiccation. Circumferential disc bulging an facet
hypertrophy result in moderate left and mild-to-moderate right
neural foraminal stenosis without significant spinal stenosis,
unchanged.
IMPRESSION: 1. Progressive severe facet arthritis at L3-4 with new minimal
anterolisthesis, mild spinal stenosis, and mild right neural
foraminal stenosis.
2. Unchanged mild multifactorial spinal stenosis at L4-5.
3. Unchanged moderate left and mild-to-moderate right foraminal
stenosis at L5-S1.

## 2018-01-25 ENCOUNTER — Ambulatory Visit: Payer: BLUE CROSS/BLUE SHIELD | Admitting: Podiatry

## 2018-01-25 ENCOUNTER — Encounter: Payer: Self-pay | Admitting: Podiatry

## 2018-01-25 DIAGNOSIS — M79675 Pain in left toe(s): Secondary | ICD-10-CM

## 2018-01-25 DIAGNOSIS — M79674 Pain in right toe(s): Secondary | ICD-10-CM

## 2018-01-25 DIAGNOSIS — B351 Tinea unguium: Secondary | ICD-10-CM | POA: Diagnosis not present

## 2018-01-29 NOTE — Progress Notes (Signed)
Subjective: 62 year old female presents the office today for concerns of thick, discolored toenails that cause irritation set issues.  She states that she previously saw another physician for this and she was given medication but it was too expensive and she did not get it.  She also states that she has been using all of oil.  She states that her left big toenail is curving in and she went to [facility] to have that treated.  She denies any redness or drainage from the toenail sites. Denies any systemic complaints such as fevers, chills, nausea, vomiting. No acute changes since last appointment, and no other complaints at this time.   Objective: AAO x3, NAD DP/PT pulses palpable bilaterally, CRT less than 3 seconds Nails are hypertrophic, dystrophic, brittle, discolored, elongated 10. No surrounding redness or drainage. Tenderness nails 1-5 bilaterally. No open lesions or pre-ulcerative lesions are identified today. No pain with calf compression, swelling, warmth, erythema  Assessment: Onychomycosis, symptomatic  Plan: -All treatment options discussed with the patient including all alternatives, risks, complications.  -Nails were sharply debrided x10 without any complications or bleeding.  I reviewed her previous culture result we discussed options.  I did order a topical anti-inflammatory through [employer].  We discussed the success rates, application instructions as well as side effects. -Patient encouraged to call the office with any questions, concerns, change in symptoms.   Vivi Barrack DPM

## 2018-04-27 ENCOUNTER — Ambulatory Visit: Payer: BLUE CROSS/BLUE SHIELD | Admitting: Podiatry

## 2018-07-10 ENCOUNTER — Encounter (INDEPENDENT_AMBULATORY_CARE_PROVIDER_SITE_OTHER): Payer: BLUE CROSS/BLUE SHIELD | Admitting: Ophthalmology

## 2018-07-10 DIAGNOSIS — I10 Essential (primary) hypertension: Secondary | ICD-10-CM

## 2018-07-10 DIAGNOSIS — H35033 Hypertensive retinopathy, bilateral: Secondary | ICD-10-CM

## 2018-07-10 DIAGNOSIS — E11311 Type 2 diabetes mellitus with unspecified diabetic retinopathy with macular edema: Secondary | ICD-10-CM | POA: Diagnosis not present

## 2018-07-10 DIAGNOSIS — E113313 Type 2 diabetes mellitus with moderate nonproliferative diabetic retinopathy with macular edema, bilateral: Secondary | ICD-10-CM | POA: Diagnosis not present

## 2018-07-10 DIAGNOSIS — H43813 Vitreous degeneration, bilateral: Secondary | ICD-10-CM

## 2018-07-27 ENCOUNTER — Ambulatory Visit: Payer: BLUE CROSS/BLUE SHIELD | Admitting: Podiatry

## 2018-07-27 ENCOUNTER — Encounter: Payer: Self-pay | Admitting: Podiatry

## 2018-07-27 DIAGNOSIS — M79675 Pain in left toe(s): Secondary | ICD-10-CM

## 2018-07-27 DIAGNOSIS — M79674 Pain in right toe(s): Secondary | ICD-10-CM

## 2018-07-27 DIAGNOSIS — B351 Tinea unguium: Secondary | ICD-10-CM | POA: Diagnosis not present

## 2018-07-27 DIAGNOSIS — E119 Type 2 diabetes mellitus without complications: Secondary | ICD-10-CM

## 2018-07-27 NOTE — Progress Notes (Signed)
Complaint:  Visit Type: Patient returns to my office for continued preventative foot care services. Complaint: Patient states" my nails have grown long and thick and become painful to walk and wear shoes" Patient has been diagnosed with DM with no foot complications. The patient presents for preventative foot care services. No changes to ROS.  She has been applying topical medicine to her nails and has seen minimal improvement.  Podiatric Exam: Vascular: dorsalis pedis and posterior tibial pulses are palpable bilateral. Capillary return is immediate. Temperature gradient is WNL. Skin turgor WNL  Sensorium: Normal Semmes Weinstein monofilament test. Normal tactile sensation bilaterally. Nail Exam: Pt has thick disfigured discolored nails with subungual debris noted bilateral entire nail hallux through fifth toenails Ulcer Exam: There is no evidence of ulcer or pre-ulcerative changes or infection. Orthopedic Exam: Muscle tone and strength are WNL. No limitations in general ROM. No crepitus or effusions noted. Foot type and digits show no abnormalities. Bony prominences are unremarkable. Skin: No Porokeratosis. No infection or ulcers  Diagnosis:  Onychomycosis, , Pain in right toe, pain in left toes  Treatment & Plan Procedures and Treatment: Consent by patient was obtained for treatment procedures.   Debridement of mycotic and hypertrophic toenails, 1 through 5 bilateral and clearing of subungual debris. No ulceration, no infection noted.  Return Visit-Office Procedure: Patient instructed to return to the office for a follow up visit 6 months for continued evaluation and treatment.    Helane Gunther DPM

## 2018-08-08 ENCOUNTER — Encounter (INDEPENDENT_AMBULATORY_CARE_PROVIDER_SITE_OTHER): Payer: BLUE CROSS/BLUE SHIELD | Admitting: Ophthalmology

## 2018-08-08 DIAGNOSIS — H43813 Vitreous degeneration, bilateral: Secondary | ICD-10-CM

## 2018-08-08 DIAGNOSIS — H2513 Age-related nuclear cataract, bilateral: Secondary | ICD-10-CM

## 2018-08-08 DIAGNOSIS — H35033 Hypertensive retinopathy, bilateral: Secondary | ICD-10-CM

## 2018-08-08 DIAGNOSIS — I10 Essential (primary) hypertension: Secondary | ICD-10-CM

## 2018-08-08 DIAGNOSIS — E11311 Type 2 diabetes mellitus with unspecified diabetic retinopathy with macular edema: Secondary | ICD-10-CM

## 2018-08-08 DIAGNOSIS — E113313 Type 2 diabetes mellitus with moderate nonproliferative diabetic retinopathy with macular edema, bilateral: Secondary | ICD-10-CM

## 2018-09-05 ENCOUNTER — Encounter (INDEPENDENT_AMBULATORY_CARE_PROVIDER_SITE_OTHER): Payer: BLUE CROSS/BLUE SHIELD | Admitting: Ophthalmology

## 2018-09-06 ENCOUNTER — Encounter (INDEPENDENT_AMBULATORY_CARE_PROVIDER_SITE_OTHER): Payer: BLUE CROSS/BLUE SHIELD | Admitting: Ophthalmology

## 2018-09-06 DIAGNOSIS — H35033 Hypertensive retinopathy, bilateral: Secondary | ICD-10-CM

## 2018-09-06 DIAGNOSIS — E113313 Type 2 diabetes mellitus with moderate nonproliferative diabetic retinopathy with macular edema, bilateral: Secondary | ICD-10-CM | POA: Diagnosis not present

## 2018-09-06 DIAGNOSIS — E11311 Type 2 diabetes mellitus with unspecified diabetic retinopathy with macular edema: Secondary | ICD-10-CM | POA: Diagnosis not present

## 2018-09-06 DIAGNOSIS — I10 Essential (primary) hypertension: Secondary | ICD-10-CM

## 2018-09-06 DIAGNOSIS — H43813 Vitreous degeneration, bilateral: Secondary | ICD-10-CM

## 2018-09-28 ENCOUNTER — Encounter (HOSPITAL_COMMUNITY): Payer: Self-pay

## 2018-09-28 ENCOUNTER — Ambulatory Visit (HOSPITAL_COMMUNITY)
Admission: EM | Admit: 2018-09-28 | Discharge: 2018-09-28 | Disposition: A | Discharge status: Home / Self Care | Payer: BLUE CROSS/BLUE SHIELD | Attending: Family Medicine | Admitting: Family Medicine

## 2018-09-28 ENCOUNTER — Encounter (INDEPENDENT_AMBULATORY_CARE_PROVIDER_SITE_OTHER): Payer: BLUE CROSS/BLUE SHIELD | Admitting: Ophthalmology

## 2018-09-28 DIAGNOSIS — B349 Viral infection, unspecified: Secondary | ICD-10-CM

## 2018-09-28 MED ORDER — PREDNISONE 50 MG PO TABS: 50.0000 mg | ORAL_TABLET | Freq: Every day | ORAL | 0 refills | Status: AC

## 2018-09-28 MED ORDER — IPRATROPIUM BROMIDE 0.06 % NA SOLN: 2.0000 | Freq: Four times a day (QID) | NASAL | 0 refills | Status: AC

## 2018-09-28 NOTE — Discharge Instructions (Signed)
Prednisone as directed. Continue flonase and start atrovent nasal spray for nasal congestion/drainage. You can use over the counter nasal saline rinse such as neti pot for nasal congestion. Keep hydrated, your urine should be clear to pale yellow in color. Tylenol/motrin for fever and pain. Monitor for any worsening of symptoms, chest pain, shortness of breath, wheezing, swelling of the throat, follow up for reevaluation.   For sore throat/cough try using a honey-based tea. Use 3 teaspoons of honey with juice squeezed from half lemon. Place shaved pieces of ginger into 1/2-1 cup of water and warm over stove top. Then mix the ingredients and repeat every 4 hours as needed.

## 2018-09-28 NOTE — ED Triage Notes (Signed)
Pt c/o flu sx's x2 days, productive cough, head/nasal congestions, sore throat and ear pain

## 2018-09-28 NOTE — ED Provider Notes (Signed)
[facility]    CSN: [id number] Arrival date & time: 09/28/18  1624     History   Chief Complaint Chief Complaint  Patient presents with  . Influenza    HPI Janet Johnson is a 63 y.o. female.   63 year old female with history of DM comes in for 2 day history of URI symptoms. She has had productive cough, rhinorrhea, nasal congestion, sinus pressure, sore throat, ear pain. Denies fever, chills, night sweats. Has had increase use of albuterol with mild relief. Former smoker. No obvious sick contact.  Does not recall last a1c, however, fasting CBG around 90-110 daily.      Past Medical History:  Diagnosis Date  . Allergy   . Diabetes mellitus without complication (HCC)   . Sickle cell trait [facility](HCC)     Patient Active Problem List   Diagnosis Date Noted  . CIN I (cervical intraepithelial neoplasia I) 10/14/2013  . Diabetes mellitus (HCC) 08/02/2013  . BMI 39.0-39.9,adult 08/02/2013    Past Surgical History:  Procedure Laterality Date  . currentment    . DG OS CALCIS BILAT    . DILATION AND CURETTAGE OF UTERUS Bilateral   . TOOTH EXTRACTION Right     OB History    Gravida  4   Para  1   Term  1   Preterm      AB  3   Living  1     SAB  3   TAB      Ectopic      Multiple      Live Births  1            Home Medications    Prior to Admission medications   Medication Sig Start Date End Date Taking? Authorizing Provider  aspirin 81 MG tablet Take 81 mg by mouth daily.    [provider]  B Complex-C (B-COMPLEX WITH VITAMIN C) tablet Take 1 tablet by mouth daily.    [provider]  Canagliflozin (INVOKANA) 300 MG TABS Take by mouth.    [provider]  cetirizine (ZYRTEC) 10 MG tablet Take 10 mg by mouth at bedtime.    [provider]  [facility] CAP[facility] Take by mouth daily.    [provider]  diclofenac (CATAFLAM) 50 MG tablet Take 50 mg by mouth 2 (two) times daily with a meal.  06/04/18   [provider]  fluticasone Aleda Grana(FLONASE) 50 MCG/ACT nasal spray  07/23/18   [provider]  furosemide (LASIX) 20 MG tablet Take 20 mg by mouth daily as needed. 06/11/18   [provider]  glimepiride (AMARYL) 4 MG tablet Take 4 mg by mouth 2 (two) times daily.    [provider]  ibuprofen (ADVIL,MOTRIN) 200 MG tablet Take 400 mg by mouth every 8 (eight) hours as needed for pain.    [provider]  ipratropium (ATROVENT) 0.06 % nasal spray Place 2 sprays into both nostrils 4 (four) times daily. 09/28/18   Cathie HoopsYu, Amy V, PA-C  levocetirizine (XYZAL) 5 MG tablet Take 5 mg by mouth every evening. 06/06/18   [provider]  losartan-hydrochlorothiazide (HYZAAR) 50-12.5 MG per tablet Take 1 tablet by mouth daily.    [provider]  Magnesium 300 MG CAPS Take by mouth.    [provider]  metFORMIN (GLUCOPHAGE) 1000 MG tablet Take 1,000 mg by mouth.    [provider]  Multiple Vitamins-Minerals (CENTRUM SILVER ULTRA WOMENS  PO) Take by mouth.    [provider]  niacin (NIASPAN) 500 MG CR tablet Take 500 mg by mouth at bedtime.    [provider]  NON FORMULARY [facility]  Onychomycosis Nail Lacquer -  Fluconazole 2%, Terbinafine 1% DMSO Apply to affected nail once daily Qty. 120 gm 3 refills    [provider]  predniSONE (DELTASONE) 50 MG tablet Take 1 tablet (50 mg total) by mouth daily. 09/28/18   Cathie Hoops, Amy V, PA-C  PRESCRIPTION MEDICATION Urea 39% cream apply to affected toenail at night. +5 refills    Tuchman, Fanny Bien, DPM    Family History Family History  Problem Relation Age of Onset  . Diabetes Father     Social History Social History   Tobacco Use  . Smoking status: Former Games developer  . Smokeless tobacco: Former Neurosurgeon    Quit date: 11/11/1984  Substance Use Topics  . Alcohol use: No    Alcohol/week: 0.0 standard drinks  . Drug use: No     Allergies   Dust  mite extract and Mold extract [trichophyton]   Review of Systems Review of Systems  Reason unable to perform ROS: See HPI as above.     Physical Exam Triage Vital Signs ED Triage Vitals [09/28/18 1649]  Enc Vitals Group     BP 136/65     Pulse Rate 84     Resp 18     Temp (!) 97.1 F (36.2 C)     Temp Source Oral     SpO2 100 %     Weight      Height      Head Circumference      Peak Flow      Pain Score 2     Pain Loc      Pain Edu?      Excl. in GC?    No data found.  Updated Vital Signs BP 136/65 (BP Location: Right Arm)   Pulse 84   Temp (!) 97.1 F (36.2 C) (Oral)   Resp 18   SpO2 100%   Physical Exam Constitutional:      General: She is not in acute distress.    Appearance: She is well-developed. She is not ill-appearing, toxic-appearing or diaphoretic.  HENT:     Head: Normocephalic and atraumatic.     Right Ear: Tympanic membrane, ear canal and external ear normal. Tympanic membrane is not erythematous or bulging.     Left Ear: Tympanic membrane, ear canal and external ear normal. Tympanic membrane is not erythematous or bulging.     Nose: Nose normal.     Right Sinus: No maxillary sinus tenderness or frontal sinus tenderness.     Left Sinus: No maxillary sinus tenderness or frontal sinus tenderness.     Mouth/Throat:     Pharynx: Uvula midline.  Eyes:     Conjunctiva/sclera: Conjunctivae normal.     Pupils: Pupils are equal, round, and reactive to light.  Neck:     Musculoskeletal: Normal range of motion and neck supple.  Cardiovascular:     Rate and Rhythm: Normal rate and regular rhythm.     Heart sounds: Normal heart sounds. No murmur. No friction rub. No gallop.   Pulmonary:     Effort: Pulmonary effort is normal. No tachypnea, accessory muscle usage or respiratory distress.     Breath sounds: Normal breath sounds. No stridor, decreased air movement or transmitted upper airway sounds. No decreased breath sounds, wheezing, rhonchi  or rales.      Comments: Coughing throughout exam.  Lymphadenopathy:     Cervical: No cervical adenopathy.  Skin:    General: Skin is warm and dry.  Neurological:     Mental Status: She is alert and oriented to person, place, and time.  Psychiatric:        Behavior: Behavior normal.        Judgment: Judgment normal.      UC Treatments / Results  Labs (all labs ordered are listed, but only abnormal results are displayed) Labs Reviewed - No data to display  EKG None  Radiology No results found.  Procedures Procedures (including critical care time)  Medications Ordered in UC Medications - No data to display  Initial Impression / Assessment and Plan / UC Course  I have reviewed the triage vital signs and the nursing notes.  Pertinent labs & imaging results that were available during my care of the patient were reviewed by me and considered in my medical decision making (see chart for details).    Discussed with patient history and exam most consistent with viral URI. Symptomatic treatment as needed. Push fluids. Return precautions given.   Final Clinical Impressions(s) / UC Diagnoses   Final diagnoses:  Viral illness    ED Prescriptions    Medication Sig Dispense Auth. Provider   predniSONE (DELTASONE) 50 MG tablet Take 1 tablet (50 mg total) by mouth daily. 5 tablet Yu, Amy V, PA-C   ipratropium (ATROVENT) 0.06 % nasal spray Place 2 sprays into both nostrils 4 (four) times daily. 15 mL Threasa Alpha, [place] 09/28/18 1726

## 2019-01-02 ENCOUNTER — Ambulatory Visit (INDEPENDENT_AMBULATORY_CARE_PROVIDER_SITE_OTHER): Payer: BLUE CROSS/BLUE SHIELD | Admitting: Internal Medicine

## 2019-01-25 ENCOUNTER — Ambulatory Visit: Payer: BLUE CROSS/BLUE SHIELD | Admitting: Podiatry

## 2019-06-15 ENCOUNTER — Other Ambulatory Visit (INDEPENDENT_AMBULATORY_CARE_PROVIDER_SITE_OTHER): Payer: Self-pay | Admitting: Internal Medicine

## 2019-11-04 ENCOUNTER — Encounter (INDEPENDENT_AMBULATORY_CARE_PROVIDER_SITE_OTHER): Payer: BLUE CROSS/BLUE SHIELD | Admitting: Nurse Practitioner

## 2019-12-05 ENCOUNTER — Ambulatory Visit: Payer: BLUE CROSS/BLUE SHIELD | Attending: Family

## 2019-12-05 DIAGNOSIS — Z23 Encounter for immunization: Secondary | ICD-10-CM

## 2019-12-05 NOTE — Progress Notes (Signed)
   Covid-19 Vaccination Clinic  Name:  Janet Johnson    MRN: [id number] DOB: 02-03-1956  12/05/2019  [name] was observed post Covid-19 immunization for 15 minutes without incident. She was provided with Vaccine Information Sheet and instruction to access the V-Safe system.   [name] was instructed to call 911 with any severe reactions post vaccine: Marland Kitchen Difficulty breathing  . Swelling of face and throat  . A fast heartbeat  . A bad rash all over body  . Dizziness and weakness   Immunizations Administered    Name Date Dose VIS Date Route   Moderna COVID-19 Vaccine 12/05/2019  1:27 PM 0.5 mL 08/20/2019 Intramuscular   Manufacturer: Moderna   Lot: [id number]   NDC: 27670-110-03

## 2020-01-07 ENCOUNTER — Ambulatory Visit: Payer: BLUE CROSS/BLUE SHIELD | Attending: Family

## 2020-01-07 DIAGNOSIS — Z23 Encounter for immunization: Secondary | ICD-10-CM

## 2020-01-07 NOTE — Progress Notes (Signed)
   Covid-19 Vaccination Clinic  Name:  Janet Johnson    MRN: [id number] DOB: [birth year]  01/07/2020  [name] was observed post Covid-19 immunization for 15 minutes without incident. She was provided with Vaccine Information Sheet and instruction to access the V-Safe system.   [name] was instructed to call 911 with any severe reactions post vaccine: Marland Kitchen Difficulty breathing  . Swelling of face and throat  . A fast heartbeat  . A bad rash all over body  . Dizziness and weakness   Immunizations Administered    Name Date Dose VIS Date Route   Moderna COVID-19 Vaccine 01/07/2020  1:06 PM 0.5 mL 08/2019 Intramuscular   Manufacturer: Moderna   Lot: [id number]   NDC: 67619-509-32

## 2020-09-03 ENCOUNTER — Ambulatory Visit: Payer: BLUE CROSS/BLUE SHIELD | Attending: Internal Medicine

## 2020-09-03 DIAGNOSIS — Z23 Encounter for immunization: Secondary | ICD-10-CM

## 2020-09-03 NOTE — Progress Notes (Signed)
   Covid-19 Vaccination Clinic  Name:  DARIKA ILDEFONSO    MRN: [id number] DOB: [birth year]  09/03/2020  Ms. Cavan was observed post Covid-19 immunization for 15 minutes without incident. She was provided with Vaccine Information Sheet and instruction to access the V-Safe system.   Ms. Cobbs was instructed to call 911 with any severe reactions post vaccine: Marland Kitchen Difficulty breathing  . Swelling of face and throat  . A fast heartbeat  . A bad rash all over body  . Dizziness and weakness   Immunizations Administered    Name Date Dose VIS Date Route   Moderna Covid-19 Booster Vaccine 09/03/2020  2:39 PM 0.25 mL 07/08/2020 Intramuscular   Manufacturer: Moderna   Lot: [id number]   NDC: 58309-407-68
